# Patient Record
Sex: Female | Born: 2000
Health system: Southern US, Community
[De-identification: ages and names within clinical notes are randomized; demographics above are authoritative.]

## PROBLEM LIST (undated history)

## (undated) ENCOUNTER — Inpatient Hospital Stay (HOSPITAL_COMMUNITY): Payer: Self-pay

## (undated) DIAGNOSIS — O139 Gestational [pregnancy-induced] hypertension without significant proteinuria, unspecified trimester: Secondary | ICD-10-CM

## (undated) DIAGNOSIS — N3010 Interstitial cystitis (chronic) without hematuria: Secondary | ICD-10-CM

## (undated) DIAGNOSIS — I10 Essential (primary) hypertension: Secondary | ICD-10-CM

## (undated) DIAGNOSIS — Z789 Other specified health status: Secondary | ICD-10-CM

## (undated) HISTORY — DX: Gestational (pregnancy-induced) hypertension without significant proteinuria, unspecified trimester: O13.9

## (undated) HISTORY — PX: TUMOR REMOVAL: SHX12

## (undated) HISTORY — PX: OTHER SURGICAL HISTORY: SHX169

---
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
## 2018-02-10 DIAGNOSIS — N342 Other urethritis: Secondary | ICD-10-CM | POA: Diagnosis not present

## 2018-02-10 DIAGNOSIS — Z68.41 Body mass index (BMI) pediatric, less than 5th percentile for age: Secondary | ICD-10-CM | POA: Diagnosis not present

## 2018-02-10 DIAGNOSIS — R350 Frequency of micturition: Secondary | ICD-10-CM | POA: Diagnosis not present

## 2018-02-10 DIAGNOSIS — R300 Dysuria: Secondary | ICD-10-CM | POA: Diagnosis not present

## 2018-02-10 DIAGNOSIS — Z1389 Encounter for screening for other disorder: Secondary | ICD-10-CM | POA: Diagnosis not present

## 2018-03-29 DIAGNOSIS — R300 Dysuria: Secondary | ICD-10-CM | POA: Diagnosis not present

## 2018-03-29 DIAGNOSIS — N3001 Acute cystitis with hematuria: Secondary | ICD-10-CM | POA: Diagnosis not present

## 2018-04-07 DIAGNOSIS — Z68.41 Body mass index (BMI) pediatric, less than 5th percentile for age: Secondary | ICD-10-CM | POA: Diagnosis not present

## 2018-04-07 DIAGNOSIS — N342 Other urethritis: Secondary | ICD-10-CM | POA: Diagnosis not present

## 2018-04-07 DIAGNOSIS — Z23 Encounter for immunization: Secondary | ICD-10-CM | POA: Diagnosis not present

## 2018-04-07 DIAGNOSIS — R350 Frequency of micturition: Secondary | ICD-10-CM | POA: Diagnosis not present

## 2018-05-23 DIAGNOSIS — L03031 Cellulitis of right toe: Secondary | ICD-10-CM | POA: Diagnosis not present

## 2018-05-23 DIAGNOSIS — M79674 Pain in right toe(s): Secondary | ICD-10-CM | POA: Diagnosis not present

## 2018-05-24 DIAGNOSIS — L0390 Cellulitis, unspecified: Secondary | ICD-10-CM | POA: Diagnosis not present

## 2018-05-24 DIAGNOSIS — Z1389 Encounter for screening for other disorder: Secondary | ICD-10-CM | POA: Diagnosis not present

## 2018-05-24 DIAGNOSIS — Z68.41 Body mass index (BMI) pediatric, less than 5th percentile for age: Secondary | ICD-10-CM | POA: Diagnosis not present

## 2018-09-30 DIAGNOSIS — J329 Chronic sinusitis, unspecified: Secondary | ICD-10-CM | POA: Diagnosis not present

## 2018-09-30 DIAGNOSIS — J111 Influenza due to unidentified influenza virus with other respiratory manifestations: Secondary | ICD-10-CM | POA: Diagnosis not present

## 2018-11-19 DIAGNOSIS — R300 Dysuria: Secondary | ICD-10-CM | POA: Diagnosis not present

## 2018-11-29 DIAGNOSIS — R102 Pelvic and perineal pain: Secondary | ICD-10-CM | POA: Diagnosis not present

## 2018-11-29 DIAGNOSIS — Z90721 Acquired absence of ovaries, unilateral: Secondary | ICD-10-CM | POA: Diagnosis not present

## 2019-02-08 DIAGNOSIS — R358 Other polyuria: Secondary | ICD-10-CM | POA: Diagnosis not present

## 2019-02-08 DIAGNOSIS — Z1329 Encounter for screening for other suspected endocrine disorder: Secondary | ICD-10-CM | POA: Diagnosis not present

## 2019-02-08 DIAGNOSIS — Z0000 Encounter for general adult medical examination without abnormal findings: Secondary | ICD-10-CM | POA: Diagnosis not present

## 2019-02-08 DIAGNOSIS — E663 Overweight: Secondary | ICD-10-CM | POA: Diagnosis not present

## 2019-02-08 DIAGNOSIS — N342 Other urethritis: Secondary | ICD-10-CM | POA: Diagnosis not present

## 2019-02-08 DIAGNOSIS — N3020 Other chronic cystitis without hematuria: Secondary | ICD-10-CM | POA: Diagnosis not present

## 2019-02-15 DIAGNOSIS — E663 Overweight: Secondary | ICD-10-CM | POA: Diagnosis not present

## 2019-02-15 DIAGNOSIS — E785 Hyperlipidemia, unspecified: Secondary | ICD-10-CM | POA: Diagnosis not present

## 2019-02-15 DIAGNOSIS — N342 Other urethritis: Secondary | ICD-10-CM | POA: Diagnosis not present

## 2019-02-15 DIAGNOSIS — Z0001 Encounter for general adult medical examination with abnormal findings: Secondary | ICD-10-CM | POA: Diagnosis not present

## 2019-03-01 DIAGNOSIS — R300 Dysuria: Secondary | ICD-10-CM | POA: Diagnosis not present

## 2019-03-01 DIAGNOSIS — R3121 Asymptomatic microscopic hematuria: Secondary | ICD-10-CM | POA: Diagnosis not present

## 2019-03-21 DIAGNOSIS — N3020 Other chronic cystitis without hematuria: Secondary | ICD-10-CM | POA: Diagnosis not present

## 2019-03-21 DIAGNOSIS — R300 Dysuria: Secondary | ICD-10-CM | POA: Diagnosis not present

## 2019-04-05 DIAGNOSIS — R3121 Asymptomatic microscopic hematuria: Secondary | ICD-10-CM | POA: Diagnosis not present

## 2019-04-05 DIAGNOSIS — R3129 Other microscopic hematuria: Secondary | ICD-10-CM | POA: Diagnosis not present

## 2019-04-13 DIAGNOSIS — N3020 Other chronic cystitis without hematuria: Secondary | ICD-10-CM | POA: Diagnosis not present

## 2019-04-13 DIAGNOSIS — J069 Acute upper respiratory infection, unspecified: Secondary | ICD-10-CM | POA: Diagnosis not present

## 2019-04-13 DIAGNOSIS — R300 Dysuria: Secondary | ICD-10-CM | POA: Diagnosis not present

## 2019-04-19 DIAGNOSIS — R3121 Asymptomatic microscopic hematuria: Secondary | ICD-10-CM | POA: Diagnosis not present

## 2019-04-19 DIAGNOSIS — R300 Dysuria: Secondary | ICD-10-CM | POA: Diagnosis not present

## 2019-08-23 DIAGNOSIS — R300 Dysuria: Secondary | ICD-10-CM | POA: Diagnosis not present

## 2019-08-23 DIAGNOSIS — N3011 Interstitial cystitis (chronic) with hematuria: Secondary | ICD-10-CM | POA: Diagnosis not present

## 2019-08-23 DIAGNOSIS — N3020 Other chronic cystitis without hematuria: Secondary | ICD-10-CM | POA: Diagnosis not present

## 2019-08-23 DIAGNOSIS — J069 Acute upper respiratory infection, unspecified: Secondary | ICD-10-CM | POA: Diagnosis not present

## 2019-08-30 DIAGNOSIS — R8290 Unspecified abnormal findings in urine: Secondary | ICD-10-CM | POA: Diagnosis not present

## 2019-08-30 DIAGNOSIS — N3010 Interstitial cystitis (chronic) without hematuria: Secondary | ICD-10-CM | POA: Diagnosis not present

## 2019-09-16 DIAGNOSIS — Z20822 Contact with and (suspected) exposure to covid-19: Secondary | ICD-10-CM | POA: Diagnosis not present

## 2019-09-16 DIAGNOSIS — Z20828 Contact with and (suspected) exposure to other viral communicable diseases: Secondary | ICD-10-CM | POA: Diagnosis not present

## 2019-09-23 DIAGNOSIS — R3915 Urgency of urination: Secondary | ICD-10-CM | POA: Diagnosis not present

## 2019-09-23 DIAGNOSIS — M7918 Myalgia, other site: Secondary | ICD-10-CM | POA: Diagnosis not present

## 2019-09-23 DIAGNOSIS — R350 Frequency of micturition: Secondary | ICD-10-CM | POA: Diagnosis not present

## 2019-09-23 DIAGNOSIS — N3010 Interstitial cystitis (chronic) without hematuria: Secondary | ICD-10-CM | POA: Diagnosis not present

## 2020-03-13 DIAGNOSIS — B351 Tinea unguium: Secondary | ICD-10-CM | POA: Diagnosis not present

## 2020-03-13 DIAGNOSIS — R21 Rash and other nonspecific skin eruption: Secondary | ICD-10-CM | POA: Diagnosis not present

## 2020-03-13 DIAGNOSIS — J069 Acute upper respiratory infection, unspecified: Secondary | ICD-10-CM | POA: Diagnosis not present

## 2020-03-13 DIAGNOSIS — R300 Dysuria: Secondary | ICD-10-CM | POA: Diagnosis not present

## 2020-03-13 DIAGNOSIS — N3020 Other chronic cystitis without hematuria: Secondary | ICD-10-CM | POA: Diagnosis not present

## 2020-03-13 DIAGNOSIS — N3011 Interstitial cystitis (chronic) with hematuria: Secondary | ICD-10-CM | POA: Diagnosis not present

## 2020-08-06 ENCOUNTER — Ambulatory Visit
Admission: EM | Admit: 2020-08-06 | Discharge: 2020-08-06 | Disposition: A | Discharge status: Home / Self Care | Payer: BC Managed Care – PPO | Attending: Family Medicine | Admitting: Family Medicine

## 2020-08-06 ENCOUNTER — Other Ambulatory Visit: Payer: Self-pay

## 2020-08-06 DIAGNOSIS — B349 Viral infection, unspecified: Secondary | ICD-10-CM

## 2020-08-06 DIAGNOSIS — R52 Pain, unspecified: Secondary | ICD-10-CM

## 2020-08-06 DIAGNOSIS — R509 Fever, unspecified: Secondary | ICD-10-CM

## 2020-08-06 DIAGNOSIS — R519 Headache, unspecified: Secondary | ICD-10-CM | POA: Diagnosis not present

## 2020-08-06 DIAGNOSIS — R112 Nausea with vomiting, unspecified: Secondary | ICD-10-CM

## 2020-08-06 DIAGNOSIS — Z1152 Encounter for screening for COVID-19: Secondary | ICD-10-CM | POA: Diagnosis not present

## 2020-08-06 DIAGNOSIS — R000 Tachycardia, unspecified: Secondary | ICD-10-CM

## 2020-08-06 DIAGNOSIS — R059 Cough, unspecified: Secondary | ICD-10-CM

## 2020-08-06 DIAGNOSIS — J029 Acute pharyngitis, unspecified: Secondary | ICD-10-CM | POA: Diagnosis not present

## 2020-08-06 DIAGNOSIS — R6883 Chills (without fever): Secondary | ICD-10-CM

## 2020-08-06 MED ORDER — ONDANSETRON HCL 4 MG PO TABS: 4.0000 mg | ORAL_TABLET | Freq: Four times a day (QID) | ORAL | 0 refills | Status: DC

## 2020-08-06 NOTE — Discharge Instructions (Addendum)
I have sent in Zofran for you to take one tablet every 8 hours as needed for nausea.  Your COVID and Flu tests are pending.  You should self quarantine until the test results are back.    Take Tylenol or ibuprofen as needed for fever or discomfort.  Rest and keep yourself hydrated.    Follow-up with your primary care provider if your symptoms are not improving.

## 2020-08-06 NOTE — ED Provider Notes (Addendum)
[facility]   [id number] 08/06/20 Arrival Time: 4287   CC: COVID symptoms  SUBJECTIVE: History from: patient.  Julia Sanders is a 20 y.o. female who presents with abrupt onset of nasal congestion, PND, sore throat, fever, fatigue, body aches, chills nausea, vomiting, mild cough for the last day.  Denies sick exposure to COVID, flu or strep. Denies recent travel. Has negative history of Covid. Has not completed Covid vaccines. Has not taken OTC medications for this. There are no aggravating or alleviating factors. Denies previous symptoms in the past. Denies fever, fatigue, sinus pain, rhinorrhea, SOB, wheezing, chest pain, nausea, changes in bowel or bladder habits.    ROS: As per HPI.  All other pertinent ROS negative.     No past medical history on file. Past Surgical History:  Procedure Laterality Date  . cyst removed from jaw     No Known Allergies No current facility-administered medications on file prior to encounter.   Current Outpatient Medications on File Prior to Encounter  Medication Sig Dispense Refill  . ibuprofen (ADVIL,MOTRIN) 200 MG tablet Take 600 mg by mouth daily as needed. For pain     . naproxen sodium (ANAPROX) 220 MG tablet Take 220 mg by mouth at bedtime as needed. For pain      Social History   Socioeconomic History  . Marital status: Single    Spouse name: Not on file  . Number of children: Not on file  . Years of education: Not on file  . Highest education level: Not on file  Occupational History  . Not on file  Tobacco Use  . Smoking status: Never Smoker  . Smokeless tobacco: Not on file  Substance and Sexual Activity  . Alcohol use: No  . Drug use: No  . Sexual activity: Never  Other Topics Concern  . Not on file  Social History Narrative  . Not on file   Social Determinants of Health   Financial Resource Strain: Not on file  Food Insecurity: Not on file  Transportation Needs: Not on file  Physical Activity: Not on  file  Stress: Not on file  Social Connections: Not on file  Intimate Partner Violence: Not on file   No family history on file.  OBJECTIVE:  Vitals:   08/06/20 0904  BP: 122/86  Pulse: (!) 121  Resp: 16  Temp: 99.3 F (37.4 C)  TempSrc: Oral  SpO2: 96%     General appearance: alert; appears fatigued, but nontoxic; speaking in full sentences and tolerating own secretions HEENT: NCAT; Ears: EACs clear, TMs pearly gray; Eyes: PERRL.  EOM grossly intact. Sinuses: nontender; Nose: nares patent without rhinorrhea, Throat: oropharynx erythematous, cobblestoning present, tonsils non erythematous or enlarged, uvula midline  Neck: supple without LAD Lungs: unlabored respirations, symmetrical air entry; cough: absent; no respiratory distress; CTAB Heart: regular rate and rhythm.  Radial pulses 2+ symmetrical bilaterally Skin: warm and dry Psychological: alert and cooperative; normal mood and affect  LABS:  No results found for this or any previous visit (from the past 24 hour(s)).   ASSESSMENT & PLAN:  1. Viral illness   2. Encounter for screening for COVID-19   3. Sore throat   4. Nonintractable headache, unspecified chronicity pattern, unspecified headache type   5. Cough   6. Body aches   7. Chills   8. Nausea and vomiting, intractability of vomiting not specified, unspecified vomiting type   9. Tachycardia   10. Fever, unspecified fever cause  Meds ordered this encounter  Medications  . ondansetron (ZOFRAN) 4 MG tablet    Sig: Take 1 tablet (4 mg total) by mouth every 6 (six) hours.    Dispense:  12 tablet    Refill:  0    Order Specific Question:   Supervising Provider    Answer:   Merrilee Jansky [id number]   Prescribe Zofran for nausea Take as directed Continue supportive care at home COVID and flu testing ordered.  It will take between 1-2 days for test results. Someone will contact you regarding abnormal results.   Work note provided Patient should remain  in quarantine until they have received Covid results.  If negative you may resume normal activities (go back to work/school) while practicing hand hygiene, social distance, and mask wearing.  If positive, patient should remain in quarantine for 10 days from symptom onset AND greater than 72 hours after symptoms resolution (absence of fever without the use of fever-reducing medication and improvement in respiratory symptoms), whichever is longer Get plenty of rest and push fluids Use OTC zyrtec for nasal congestion, runny nose, and/or sore throat Use OTC flonase for nasal congestion and runny nose Use medications daily for symptom relief Use OTC medications like ibuprofen or tylenol as needed fever or pain Call or go to the ED if you have any new or worsening symptoms such as fever, worsening cough, shortness of breath, chest tightness, chest pain, turning blue, changes in mental status.  Reviewed expectations re: course of current medical issues. Questions answered. Outlined signs and symptoms indicating need for more acute intervention. Patient verbalized understanding. After Visit Summary given.         Moshe Cipro, NP 08/06/20 1601    Moshe Cipro, NP 08/06/20 [phone]

## 2020-08-06 NOTE — ED Triage Notes (Signed)
Triaged by provider  

## 2020-08-08 LAB — COVID-19, FLU A+B NAA
Influenza A, NAA: NOT DETECTED
Influenza B, NAA: NOT DETECTED
SARS-CoV-2, NAA: DETECTED — AB

## 2020-08-13 DIAGNOSIS — N898 Other specified noninflammatory disorders of vagina: Secondary | ICD-10-CM | POA: Diagnosis not present

## 2020-08-13 DIAGNOSIS — Z6829 Body mass index (BMI) 29.0-29.9, adult: Secondary | ICD-10-CM | POA: Diagnosis not present

## 2020-08-13 DIAGNOSIS — Z113 Encounter for screening for infections with a predominantly sexual mode of transmission: Secondary | ICD-10-CM | POA: Diagnosis not present

## 2020-09-20 DIAGNOSIS — A599 Trichomoniasis, unspecified: Secondary | ICD-10-CM | POA: Diagnosis not present

## 2020-09-20 DIAGNOSIS — Z6829 Body mass index (BMI) 29.0-29.9, adult: Secondary | ICD-10-CM | POA: Diagnosis not present

## 2020-09-20 DIAGNOSIS — N3010 Interstitial cystitis (chronic) without hematuria: Secondary | ICD-10-CM | POA: Diagnosis not present

## 2020-09-20 DIAGNOSIS — N9489 Other specified conditions associated with female genital organs and menstrual cycle: Secondary | ICD-10-CM | POA: Diagnosis not present

## 2020-09-20 DIAGNOSIS — Z113 Encounter for screening for infections with a predominantly sexual mode of transmission: Secondary | ICD-10-CM | POA: Diagnosis not present

## 2020-10-22 DIAGNOSIS — Z20822 Contact with and (suspected) exposure to covid-19: Secondary | ICD-10-CM | POA: Diagnosis not present

## 2020-10-22 DIAGNOSIS — Z01812 Encounter for preprocedural laboratory examination: Secondary | ICD-10-CM | POA: Diagnosis not present

## 2020-10-22 DIAGNOSIS — N3010 Interstitial cystitis (chronic) without hematuria: Secondary | ICD-10-CM | POA: Diagnosis not present

## 2020-10-26 DIAGNOSIS — M7918 Myalgia, other site: Secondary | ICD-10-CM | POA: Diagnosis not present

## 2020-10-26 DIAGNOSIS — N3010 Interstitial cystitis (chronic) without hematuria: Secondary | ICD-10-CM | POA: Diagnosis not present

## 2021-03-07 DIAGNOSIS — N911 Secondary amenorrhea: Secondary | ICD-10-CM | POA: Diagnosis not present

## 2021-03-07 DIAGNOSIS — O200 Threatened abortion: Secondary | ICD-10-CM | POA: Diagnosis not present

## 2021-03-08 ENCOUNTER — Encounter (HOSPITAL_COMMUNITY): Payer: Self-pay | Admitting: Obstetrics and Gynecology

## 2021-03-08 ENCOUNTER — Inpatient Hospital Stay (HOSPITAL_COMMUNITY)
Admission: AD | Admit: 2021-03-08 | Discharge: 2021-03-08 | Disposition: A | Discharge status: Home / Self Care | Payer: BC Managed Care – PPO | Attending: Obstetrics and Gynecology | Admitting: Obstetrics and Gynecology

## 2021-03-08 DIAGNOSIS — O26891 Other specified pregnancy related conditions, first trimester: Secondary | ICD-10-CM | POA: Diagnosis not present

## 2021-03-08 DIAGNOSIS — O209 Hemorrhage in early pregnancy, unspecified: Secondary | ICD-10-CM | POA: Diagnosis not present

## 2021-03-08 DIAGNOSIS — R42 Dizziness and giddiness: Secondary | ICD-10-CM | POA: Diagnosis not present

## 2021-03-08 DIAGNOSIS — Z3A09 9 weeks gestation of pregnancy: Secondary | ICD-10-CM | POA: Insufficient documentation

## 2021-03-08 DIAGNOSIS — O034 Incomplete spontaneous abortion without complication: Secondary | ICD-10-CM | POA: Diagnosis not present

## 2021-03-08 DIAGNOSIS — R1030 Lower abdominal pain, unspecified: Secondary | ICD-10-CM | POA: Diagnosis not present

## 2021-03-08 HISTORY — DX: Other specified health status: Z78.9

## 2021-03-08 LAB — CBC WITH DIFFERENTIAL/PLATELET
Abs Immature Granulocytes: 0.03 10*3/uL (ref 0.00–0.07)
Basophils Absolute: 0.1 10*3/uL (ref 0.0–0.1)
Basophils Relative: 1 %
Eosinophils Absolute: 0.2 10*3/uL (ref 0.0–0.5)
Eosinophils Relative: 2 %
HCT: 38.9 % (ref 36.0–46.0)
Hemoglobin: 13.2 g/dL (ref 12.0–15.0)
Immature Granulocytes: 0 %
Lymphocytes Relative: 25 %
Lymphs Abs: 2.7 10*3/uL (ref 0.7–4.0)
MCH: 30.2 pg (ref 26.0–34.0)
MCHC: 33.9 g/dL (ref 30.0–36.0)
MCV: 89 fL (ref 80.0–100.0)
Monocytes Absolute: 0.9 10*3/uL (ref 0.1–1.0)
Monocytes Relative: 8 %
Neutro Abs: 6.7 10*3/uL (ref 1.7–7.7)
Neutrophils Relative %: 64 %
Platelets: 392 10*3/uL (ref 150–400)
RBC: 4.37 MIL/uL (ref 3.87–5.11)
RDW: 12 % (ref 11.5–15.5)
WBC: 10.6 10*3/uL — ABNORMAL HIGH (ref 4.0–10.5)
nRBC: 0 % (ref 0.0–0.2)

## 2021-03-08 LAB — ABO/RH: ABO/RH(D): O POS

## 2021-03-08 LAB — HCG, QUANTITATIVE, PREGNANCY: hCG, Beta Chain, Quant, S: 8631 m[IU]/mL — ABNORMAL HIGH (ref <5)

## 2021-03-08 MED ORDER — ACETAMINOPHEN 500 MG PO TABS
1000.0000 mg | ORAL_TABLET | Freq: Once | ORAL | Status: AC
  Administered 2021-03-08: 1000 mg via ORAL
  Filled 2021-03-08: qty 2

## 2021-03-08 NOTE — Discharge Instructions (Signed)
It is likely that you may be having a miscarriage.  If your bleeding is getting worse and you have any lightheadedness/dizziness/shortness of breath/worsening fatigue, fever, or abdominal pain worsens please return to the MAU.  You can take Tylenol 1000 mg at a time up to 4 times daily, you can also use ibuprofen 400 mg every 6 hours as needed for discomfort.  Please make sure that you stay hydrated with plenty of fluids.

## 2021-03-08 NOTE — MAU Note (Signed)
Started spotting on Monday.  Has gotten heavier, changed 3 soaked pads in last hour, passing golf ball sized clots. Having severe pain in LLQ. Pain started today, become constant since 1400.  Was at dr's office yesterday - did not see anything on [place], HCG was 8788, O+.  Watery loose stools x2 today

## 2021-03-08 NOTE — MAU Provider Note (Signed)
MAU NOTE  History     CSN: [id number]  Arrival date and time: 03/08/21 1648   Chief Complaint  Patient presents with   Abdominal Pain   Vaginal Bleeding        Julia Sanders is a 20 year old female presenting for evaluation of continued vaginal bleeding and left lower abdominal cramping.  She was seen yesterday by her OB/GYN, Dr. Lorane Gell, due to bleeding since Monday in the setting of first trimester pregnancy.  By her LMP she was approximately [redacted] weeks gestation. U/S was suspicious for early pregnancy loss-gestational sac/yolk sac seen with no fetal pole. Beta-hCG 8788 at that time.  She initially started having brown spotting on Monday, 8/1, however by Wednesday she started to have more bright red vaginal bleeding.  Since this late afternoon, she has had constant left lower quadrant abdominal cramping (sometimes sharp, 6/10) and heavier vaginal bleeding.  She has changed 2 pads in the past few hours with a few golf ball sized clots.  She has not noticed products of conception.  She has not tried anything to make the abdominal pain better.  She felt a little lightheaded earlier today but not anymore.  Denies any current lightheadedness/dizziness, shortness of breath, CP, fatigue or syncopal episodes, V/D, fever, chills, or abnormal vaginal discharge. Eating and drinking as normal.   OB History     Gravida  1   Para      Term      Preterm      AB      Living         SAB      IAB      Ectopic      Multiple      Live Births              Past Medical History:  Diagnosis Date   Medical history non-contributory     Past Surgical History:  Procedure Laterality Date   cyst removed from jaw     TUMOR REMOVAL     Left ovary    No family history on file.  Social History   Tobacco Use   Smoking status: Never   Smokeless tobacco: Never  Vaping Use   Vaping Use: Never used  Substance Use Topics   Alcohol use: No   Drug use: No    Allergies: No Known  Allergies  No medications prior to admission.    Review of Systems  Constitutional:  Negative for fatigue and fever.  Respiratory:  Negative for chest tightness and shortness of breath.   Gastrointestinal:  Positive for abdominal pain. Negative for abdominal distention, constipation, diarrhea and vomiting.  Genitourinary:  Positive for pelvic pain and vaginal bleeding. Negative for dysuria, vaginal discharge and vaginal pain.  Neurological:  Negative for dizziness, syncope, weakness and light-headedness.  Physical Exam   Blood pressure 127/84, pulse 86, temperature 98.1 F (36.7 C), temperature source Oral, resp. rate 18, height 6\' 2"  (1.88 m), weight 76.1 kg, last menstrual period 01/03/2021, SpO2 100 %.  Physical Exam Constitutional:      General: She is not in acute distress.    Appearance: Normal appearance. She is not ill-appearing or diaphoretic.     Comments: Comfortably sitting in bed.   HENT:     Head: Normocephalic and atraumatic.     Mouth/Throat:     Mouth: Mucous membranes are moist.  Eyes:     Extraocular Movements: Extraocular movements intact.  Cardiovascular:     Pulses: Normal pulses.  Pulmonary:     Effort: Pulmonary effort is normal.  Abdominal:     General: Abdomen is flat. There is no distension.     Palpations: Abdomen is soft.     Tenderness: There is no left CVA tenderness.     Comments: Mild tenderness in LLQ and suprapubic region without any rebounding or guarding. No facial grimace with palpation. No tenderness elsewhere with deep palpation. Negative Mcburney point tenderness.    Genitourinary:    Comments: Pelvic exam: VULVA: normal appearing vulva with no masses, tenderness or lesions, VAGINA: normal appearing vagina with normal color, no lesions, CERVIX: normal appearing cervix without lesions with vaginal bleeding present through cervical os. Mild to moderate amount of bloody mucus present in vaginal canal without pooling. No products of conception  noted.  Musculoskeletal:     Cervical back: Normal range of motion.  Skin:    General: Skin is warm and dry.     Capillary Refill: Capillary refill takes less than 2 seconds.  Neurological:     Mental Status: She is alert and oriented to person, place, and time.  Psychiatric:        Mood and Affect: Mood normal.        Behavior: Behavior normal.    MAU Course   MDM Sterile speculum exam CBC hemoglobin 13.2 ABO/Rh O+, no RhoGAM needed Beta-hCG quant 8,631 Tylenol for pain  Reassessment: Abdominal pain has significantly improved s/p Tylenol, now with 2-3/10.  She also just changed her pad with much lighter bleeding than earlier today.  She overall feels well and would like to go home.  Assessment and Plan   20 year old G1P0 presenting for evaluation of worsening vaginal bleeding/left-sided abdominal cramping in the setting of suspected early pregnancy loss via U/S on 8/4.  Gestational and yolk sac visualized without fetal pole at that time.  Afebrile and hemodynamically stable, overall well-appearing.  Benign abdominal exam today, with significant improvement following Tylenol only.  Beta-hCG downtrending.  CBC without anemia.  Offered repeat U/S, however with improvement in symptoms patient opted to go home.  Provided reassurance and expectations of SAB.  MAU precautions discussed, especially if vaginal bleeding/abdominal pain worsens or symptomatic anemia.  Tylenol/ibuprofen as needed.  Maintain follow-up lab/beta-hCG appointment with Dr. Jaynee Eagles office.  Discharged home in stable condition.  Allayne Stack 03/08/2021, 10:29 PM

## 2021-03-12 DIAGNOSIS — O209 Hemorrhage in early pregnancy, unspecified: Secondary | ICD-10-CM | POA: Diagnosis not present

## 2022-06-30 LAB — OB RESULTS CONSOLE RUBELLA ANTIBODY, IGM: Rubella: IMMUNE

## 2022-06-30 LAB — OB RESULTS CONSOLE RPR: RPR: NONREACTIVE

## 2022-06-30 LAB — OB RESULTS CONSOLE HEPATITIS B SURFACE ANTIGEN: Hepatitis B Surface Ag: NEGATIVE

## 2022-06-30 LAB — HEPATITIS C ANTIBODY: HCV Ab: NEGATIVE

## 2022-08-04 NOTE — L&D Delivery Note (Signed)
Delivery Note Julia Sanders is a 22 y.o. G2P0010 at [redacted]w[redacted]d admitted for IOL for preeclampsia w/severe features (HA, visual changes).   GBS Status:   positive, treated w/PCN  Labor course: Initial SVE: 1/60/-2. Augmentation with: AROM, Pitocin, Cytotec, and IP Foley. She then progressed to complete.  ROM: 3h 8m with clear fluid  Birth: Delivery of a Live born female  Birth Weight:   APGAR: 8, 9  Newborn Delivery   Birth date/time: 12/16/2024 02:12:52 Delivery type: Vaginal, Spontaneous         Delivered via spontaneous vaginal delivery (Presentation: OA ). Nuchal cord present: No.  Shoulders and body delivered in usual fashion. Infant placed directly on mom's abdomen for bonding/skin-to-skin, baby dried and stimulated. Cord clamped x 2 after 1 minute and cut by FOB.  Cord blood collected. Placenta delivered-Spontaneous  with 3 vessels . 20u Pitocin in 500cc LR given as a bolus prior delivery of placenta. TXA 1gm given IV near the end of 2nd stage Fundus firm with massage. Placenta inspected and appears to be intact with a 3 VC.  Sponge and instrument count were correct x2.  Intrapartum complications:  None Anesthesia:  epidural Lacerations:  1st degree Suture Repair: 3.0 Monocryl EBL (mL):248.00     Mom to postpartum.  Baby to Couplet care / Skin to Skin. Placenta to L&D   Plans to Breastfeed Contraception:  unsure Circumcision: wants inpatient  Note sent to [facility]: FT for pp visit.  Delivery Report:   Review the Delivery Report for details.     Signed: Jacklyn Shell, DNP,CNM 12/16/2022, 2:44 AM

## 2022-10-20 LAB — OB RESULTS CONSOLE HIV ANTIBODY (ROUTINE TESTING): HIV: NONREACTIVE

## 2022-12-09 LAB — OB RESULTS CONSOLE GBS: GBS: POSITIVE

## 2022-12-09 LAB — OB RESULTS CONSOLE GC/CHLAMYDIA
Chlamydia: NEGATIVE
Neisseria Gonorrhea: NEGATIVE

## 2022-12-15 ENCOUNTER — Inpatient Hospital Stay (HOSPITAL_COMMUNITY): Payer: 59 | Admitting: Anesthesiology

## 2022-12-15 ENCOUNTER — Encounter (HOSPITAL_COMMUNITY): Payer: Self-pay | Admitting: Obstetrics & Gynecology

## 2022-12-15 ENCOUNTER — Inpatient Hospital Stay (HOSPITAL_COMMUNITY)
Admission: AD | Admit: 2022-12-15 | Discharge: 2022-12-18 | DRG: 807 | Disposition: A | Discharge status: Home / Self Care | Payer: 59 | Attending: Obstetrics and Gynecology | Admitting: Obstetrics and Gynecology

## 2022-12-15 DIAGNOSIS — Z3A36 36 weeks gestation of pregnancy: Secondary | ICD-10-CM | POA: Diagnosis not present

## 2022-12-15 DIAGNOSIS — O1414 Severe pre-eclampsia complicating childbirth: Principal | ICD-10-CM | POA: Diagnosis present

## 2022-12-15 DIAGNOSIS — O368130 Decreased fetal movements, third trimester, not applicable or unspecified: Secondary | ICD-10-CM | POA: Diagnosis present

## 2022-12-15 DIAGNOSIS — O99820 Streptococcus B carrier state complicating pregnancy: Secondary | ICD-10-CM | POA: Diagnosis not present

## 2022-12-15 DIAGNOSIS — O1410 Severe pre-eclampsia, unspecified trimester: Secondary | ICD-10-CM | POA: Diagnosis present

## 2022-12-15 DIAGNOSIS — Z3490 Encounter for supervision of normal pregnancy, unspecified, unspecified trimester: Principal | ICD-10-CM | POA: Diagnosis present

## 2022-12-15 DIAGNOSIS — O1413 Severe pre-eclampsia, third trimester: Secondary | ICD-10-CM

## 2022-12-15 DIAGNOSIS — O99824 Streptococcus B carrier state complicating childbirth: Secondary | ICD-10-CM | POA: Diagnosis present

## 2022-12-15 HISTORY — DX: Essential (primary) hypertension: I10

## 2022-12-15 LAB — CBC
HCT: 33.3 % — ABNORMAL LOW (ref 36.0–46.0)
HCT: 31.9 % — ABNORMAL LOW (ref 36.0–46.0)
Hemoglobin: 10.7 g/dL — ABNORMAL LOW (ref 12.0–15.0)
Hemoglobin: 10.3 g/dL — ABNORMAL LOW (ref 12.0–15.0)
MCH: 27.5 pg (ref 26.0–34.0)
MCH: 27.2 pg (ref 26.0–34.0)
MCHC: 32.1 g/dL (ref 30.0–36.0)
MCHC: 32.3 g/dL (ref 30.0–36.0)
MCV: 85.6 fL (ref 80.0–100.0)
MCV: 84.2 fL (ref 80.0–100.0)
Platelets: 339 10*3/uL (ref 150–400)
Platelets: 347 10*3/uL (ref 150–400)
RBC: 3.89 MIL/uL (ref 3.87–5.11)
RBC: 3.79 MIL/uL — ABNORMAL LOW (ref 3.87–5.11)
RDW: 12.9 % (ref 11.5–15.5)
RDW: 12.9 % (ref 11.5–15.5)
WBC: 10.5 10*3/uL (ref 4.0–10.5)
WBC: 15.6 10*3/uL — ABNORMAL HIGH (ref 4.0–10.5)
nRBC: 0 % (ref 0.0–0.2)
nRBC: 0 % (ref 0.0–0.2)

## 2022-12-15 LAB — COMPREHENSIVE METABOLIC PANEL
ALT: 6 U/L (ref 0–44)
AST: 17 U/L (ref 15–41)
Albumin: 2.4 g/dL — ABNORMAL LOW (ref 3.5–5.0)
Alkaline Phosphatase: 102 U/L (ref 38–126)
Anion gap: 10 (ref 5–15)
BUN: 5 mg/dL — ABNORMAL LOW (ref 6–20)
CO2: 20 mmol/L — ABNORMAL LOW (ref 22–32)
Calcium: 8.7 mg/dL — ABNORMAL LOW (ref 8.9–10.3)
Chloride: 104 mmol/L (ref 98–111)
Creatinine, Ser: 0.63 mg/dL (ref 0.44–1.00)
GFR, Estimated: >60 mL/min (ref >60)
Glucose, Bld: 110 mg/dL — ABNORMAL HIGH (ref 70–99)
Potassium: 3.6 mmol/L (ref 3.5–5.1)
Sodium: 134 mmol/L — ABNORMAL LOW (ref 135–145)
Total Bilirubin: 0.4 mg/dL (ref 0.3–1.2)
Total Protein: 6.3 g/dL — ABNORMAL LOW (ref 6.5–8.1)

## 2022-12-15 LAB — PROTEIN / CREATININE RATIO, URINE
Creatinine, Urine: 153 mg/dL
Protein Creatinine Ratio: 0.11 mg/mg{Cre} (ref 0.00–0.15)
Total Protein, Urine: 17 mg/dL

## 2022-12-15 LAB — RPR: RPR Ser Ql: NONREACTIVE

## 2022-12-15 LAB — TYPE AND SCREEN
ABO/RH(D): O POS
Antibody Screen: NEGATIVE

## 2022-12-15 MED ORDER — MISOPROSTOL 25 MCG QUARTER TABLET
25.0000 ug | ORAL_TABLET | ORAL | Status: DC
  Administered 2022-12-15: 25 ug via VAGINAL
  Filled 2022-12-15: qty 1

## 2022-12-15 MED ORDER — FENTANYL-BUPIVACAINE-NACL 0.5-0.125-0.9 MG/250ML-% EP SOLN
EPIDURAL | Status: DC | PRN
  Administered 2022-12-15: 12 mL/h via EPIDURAL

## 2022-12-15 MED ORDER — MAGNESIUM SULFATE 40 GM/1000ML IV SOLN
INTRAVENOUS | Status: AC
  Filled 2022-12-15: qty 1000

## 2022-12-15 MED ORDER — LACTATED RINGERS IV SOLN: 500.0000 mL | Freq: Once | INTRAVENOUS | Status: DC

## 2022-12-15 MED ORDER — ACETAMINOPHEN 325 MG PO TABS
650.0000 mg | ORAL_TABLET | ORAL | Status: DC | PRN
  Administered 2022-12-15 – 2022-12-16 (×2): 650 mg via ORAL
  Filled 2022-12-15 (×2): qty 2

## 2022-12-15 MED ORDER — LIDOCAINE HCL (PF) 1 % IJ SOLN
INTRAMUSCULAR | Status: DC | PRN
  Administered 2022-12-15: 2 mL via EPIDURAL
  Administered 2022-12-15: 10 mL via EPIDURAL

## 2022-12-15 MED ORDER — EPHEDRINE 5 MG/ML INJ: 10.0000 mg | INTRAVENOUS | Status: DC | PRN

## 2022-12-15 MED ORDER — PHENYLEPHRINE 80 MCG/ML (10ML) SYRINGE FOR IV PUSH (FOR BLOOD PRESSURE SUPPORT): 80.0000 ug | PREFILLED_SYRINGE | INTRAVENOUS | Status: DC | PRN

## 2022-12-15 MED ORDER — LIDOCAINE HCL (PF) 1 % IJ SOLN: 30.0000 mL | INTRAMUSCULAR | Status: DC | PRN

## 2022-12-15 MED ORDER — OXYTOCIN-SODIUM CHLORIDE 30-0.9 UT/500ML-% IV SOLN
1.0000 m[IU]/min | INTRAVENOUS | Status: DC
  Administered 2022-12-15: 2 m[IU]/min via INTRAVENOUS
  Filled 2022-12-15: qty 500

## 2022-12-15 MED ORDER — OXYCODONE-ACETAMINOPHEN 5-325 MG PO TABS: 1.0000 | ORAL_TABLET | ORAL | Status: DC | PRN

## 2022-12-15 MED ORDER — OXYTOCIN-SODIUM CHLORIDE 30-0.9 UT/500ML-% IV SOLN: 2.5000 [IU]/h | INTRAVENOUS | Status: DC

## 2022-12-15 MED ORDER — TERBUTALINE SULFATE 1 MG/ML IJ SOLN: 0.2500 mg | Freq: Once | INTRAMUSCULAR | Status: DC | PRN

## 2022-12-15 MED ORDER — ONDANSETRON HCL 4 MG/2ML IJ SOLN
4.0000 mg | Freq: Four times a day (QID) | INTRAMUSCULAR | Status: DC | PRN
  Administered 2022-12-15: 4 mg via INTRAVENOUS
  Filled 2022-12-15: qty 2

## 2022-12-15 MED ORDER — SODIUM CHLORIDE 0.9 % IV SOLN
5.0000 10*6.[IU] | Freq: Once | INTRAVENOUS | Status: AC
  Administered 2022-12-15: 5 10*6.[IU] via INTRAVENOUS
  Filled 2022-12-15: qty 5

## 2022-12-15 MED ORDER — PENICILLIN G POT IN DEXTROSE 60000 UNIT/ML IV SOLN
3.0000 10*6.[IU] | INTRAVENOUS | Status: DC
  Administered 2022-12-15 (×2): 3 10*6.[IU] via INTRAVENOUS
  Filled 2022-12-15 (×5): qty 50

## 2022-12-15 MED ORDER — FENTANYL-BUPIVACAINE-NACL 0.5-0.125-0.9 MG/250ML-% EP SOLN
12.0000 mL/h | EPIDURAL | Status: DC | PRN
  Filled 2022-12-15: qty 250

## 2022-12-15 MED ORDER — DIPHENHYDRAMINE HCL 50 MG/ML IJ SOLN: 12.5000 mg | INTRAMUSCULAR | Status: DC | PRN

## 2022-12-15 MED ORDER — MAGNESIUM SULFATE BOLUS VIA INFUSION
6.0000 g | Freq: Once | INTRAVENOUS | Status: AC
  Administered 2022-12-15: 6 g via INTRAVENOUS
  Filled 2022-12-15: qty 1000

## 2022-12-15 MED ORDER — LACTATED RINGERS IV SOLN: 500.0000 mL | INTRAVENOUS | Status: DC | PRN

## 2022-12-15 MED ORDER — OXYCODONE-ACETAMINOPHEN 5-325 MG PO TABS: 2.0000 | ORAL_TABLET | ORAL | Status: DC | PRN

## 2022-12-15 MED ORDER — LABETALOL HCL 100 MG PO TABS: 100.0000 mg | ORAL_TABLET | Freq: Three times a day (TID) | ORAL | Status: DC

## 2022-12-15 MED ORDER — LACTATED RINGERS IV SOLN: INTRAVENOUS | Status: DC

## 2022-12-15 MED ORDER — SOD CITRATE-CITRIC ACID 500-334 MG/5ML PO SOLN: 30.0000 mL | ORAL | Status: DC | PRN

## 2022-12-15 MED ORDER — OXYTOCIN BOLUS FROM INFUSION
333.0000 mL | Freq: Once | INTRAVENOUS | Status: AC
  Administered 2022-12-16: 333 mL via INTRAVENOUS

## 2022-12-15 MED ORDER — MAGNESIUM SULFATE 40 GM/1000ML IV SOLN
2.0000 g/h | INTRAVENOUS | Status: AC
  Administered 2022-12-16: 2 g/h via INTRAVENOUS
  Filled 2022-12-15 (×2): qty 1000

## 2022-12-15 MED ORDER — MISOPROSTOL 50MCG HALF TABLET
50.0000 ug | ORAL_TABLET | ORAL | Status: DC
  Administered 2022-12-15: 50 ug via ORAL
  Filled 2022-12-15: qty 1

## 2022-12-15 NOTE — MAU Provider Note (Signed)
History     CSN: [id number]  Arrival date and time: 12/15/22 1118   None     Chief Complaint  Patient presents with   Headache   Hypertension   Decreased Fetal Movement   HPI This is a 22 year old G2, P0 at 36 weeks and 4 days with a pregnancy complicated by gestational hypertension.  She received her prenatal care at [facility] and was diagnosed with gestational hypertension that started the beginning of April.  She was having daily severe headaches and had a couple of severe range blood pressures.  She was placed on labetalol 100 mg, which was titrated up to 3 times a day.  Her headaches did improve and her blood pressures have remained in the mild range.  However, her headaches have began to worsen.  She rates her headaches 8 out of 10.  Additionally she gets flashing lights in her vision that last for several minutes.  She reports decreased fetal movement since earlier today.    OB History     Gravida  2   Para      Term      Preterm      AB  1   Living         SAB  1   IAB      Ectopic      Multiple      Live Births              Past Medical History:  Diagnosis Date   Hypertension    Medical history non-contributory     Past Surgical History:  Procedure Laterality Date   cyst removed from jaw     TUMOR REMOVAL     Left ovary    No family history on file.  Social History   Tobacco Use   Smoking status: Never   Smokeless tobacco: Never  Vaping Use   Vaping Use: Never used  Substance Use Topics   Alcohol use: No   Drug use: No    Allergies: No Known Allergies  Medications Prior to Admission  Medication Sig Dispense Refill Last Dose   labetalol (NORMODYNE) 300 MG tablet Take 100 mg by mouth 3 (three) times daily.      amitriptyline (ELAVIL) 25 MG tablet Take 50 mg by mouth at bedtime. (Patient not taking: Reported on 12/15/2022)   Not Taking   hydrOXYzine (ATARAX/VISTARIL) 25 MG tablet Take 50 mg by mouth  every evening.      ibuprofen (ADVIL,MOTRIN) 200 MG tablet Take 600 mg by mouth daily as needed. For pain  (Patient not taking: Reported on 12/15/2022)   Not Taking    Review of Systems Physical Exam   Blood pressure (!) 143/99, pulse 98, temperature 98.1 F (36.7 C), temperature source Oral, resp. rate 18, height 6\' 1"  (1.854 m), weight 112.6 kg, SpO2 100 %, unknown if currently breastfeeding.  Physical Exam Vitals and nursing note reviewed.  Constitutional:      Appearance: She is well-developed.  Cardiovascular:     Rate and Rhythm: Normal rate and regular rhythm.  Pulmonary:     Effort: Pulmonary effort is normal.     Breath sounds: Normal breath sounds.  Abdominal:     Palpations: Abdomen is soft.  Skin:    General: Skin is warm and dry.  Neurological:     Mental Status: She is alert.  Psychiatric:        Mood and Affect: Mood normal.  Behavior: Behavior normal.     MAU Course  Procedures NST:  Baseline: 140  Variability: moderate Accelerations: present  Decelerations: none Contractions: none  MDM The patient is already diagnosed with gestational hypertension.  At this point, with severe headaches and vision changes, the patient qualifies for diagnosis of severe features.  Will get labs and start mag.  As the patient is past 34 weeks, will move towards delivery.  Records reviewed in care everywhere.  Patient is GBS positive, will start penicillin for GBS prophylaxis.  Assessment and Plan  admit  Levie Heritage 12/15/2022, 12:50 PM

## 2022-12-15 NOTE — H&P (Signed)
OBSTETRIC ADMISSION HISTORY AND PHYSICAL  Julia Sanders is a 22 y.o. female G2P0010 with IUP at [redacted]w[redacted]d by LMP presenting for IOL for Pre E w/ SF. She reports +FMs, No LOF, no VB, no blurry vision, headaches or peripheral edema, and RUQ pain.  She plans on breast feeding. She request POPs for birth control. She received her prenatal care at  [facility]    Dating: By LMP --->  Estimated Date of Delivery: 01/08/23  Sono:    @[redacted]w[redacted]d , CWD, normal anatomy, cephalic presentation([redacted]w[redacted]d), Anterior placenta , 1328g, 51% EFW   Prenatal History/Complications: gHTN  Past Medical History: Past Medical History:  Diagnosis Date   Hypertension    Medical history non-contributory     Past Surgical History: Past Surgical History:  Procedure Laterality Date   cyst removed from jaw     TUMOR REMOVAL     Left ovary    Obstetrical History: OB History     Gravida  2   Para      Term      Preterm      AB  1   Living         SAB  1   IAB      Ectopic      Multiple      Live Births              Social History Social History   Socioeconomic History   Marital status: Single    Spouse name: Not on file   Number of children: Not on file   Years of education: Not on file   Highest education level: Not on file  Occupational History   Not on file  Tobacco Use   Smoking status: Never   Smokeless tobacco: Never  Vaping Use   Vaping Use: Never used  Substance and Sexual Activity   Alcohol use: No   Drug use: No   Sexual activity: Never  Other Topics Concern   Not on file  Social History Narrative   Not on file   Social Determinants of Health   Financial Resource Strain: Not on file  Food Insecurity: No Food Insecurity (12/15/2022)   Hunger Vital Sign    Worried About Running Out of Food in the Last Year: Never true    Ran Out of Food in the Last Year: Never true  Transportation Needs: Not on file  Physical Activity: Not on file  Stress: Not on file  Social  Connections: Not on file    Family History: History reviewed. No pertinent family history.  Allergies: No Known Allergies  Medications Prior to Admission  Medication Sig Dispense Refill Last Dose   labetalol (NORMODYNE) 300 MG tablet Take 100 mg by mouth 3 (three) times daily.      amitriptyline (ELAVIL) 25 MG tablet Take 50 mg by mouth at bedtime. (Patient not taking: Reported on 12/15/2022)   Not Taking   hydrOXYzine (ATARAX/VISTARIL) 25 MG tablet Take 50 mg by mouth every evening.      ibuprofen (ADVIL,MOTRIN) 200 MG tablet Take 600 mg by mouth daily as needed. For pain  (Patient not taking: Reported on 12/15/2022)   Not Taking     Review of Systems   All systems reviewed and negative except as stated in HPI  Blood pressure (!) 143/86, pulse 86, temperature 98.2 F (36.8 C), temperature source Oral, resp. rate 18, height 6\' 1"  (1.854 m), weight 112.6 kg, SpO2 100 %, unknown if currently breastfeeding. General appearance:  alert, cooperative, and appears stated age Lungs: normal work of breathing  Heart: regular rate and rhythm Abdomen: soft, non-tender; bowel sounds normal Pelvic: 1/60/-2 Presentation: cephalic Fetal monitoringBaseline: 145 bpm, Variability: Good {> 6 bpm), Accelerations: Reactive, and Decelerations: Absent Uterine activityNone Dilation: 2 Effacement (%): 50 Station: -2 Exam by:: Cy Bresee   Prenatal labs: ABO, Rh: --/--/O POS (05/13 1307) Antibody: NEG (05/13 1307) Rubella:  Immune RPR: NON REACTIVE (05/13 1307)  HBsAg:   NR HIV:   NR GBS:   Positive 1 hr Glucola Normal Anatomy US Normal  Prenatal Transfer Tool  Maternal Diabetes: No Genetic Screening: Normal Maternal Ultrasounds/Referrals: Normal Fetal Ultrasounds or other Referrals:  Referred to Materal Fetal Medicine  Maternal Substance Abuse:  No Significant Maternal Medications:  Meds include: Other: Labetalol  Significant Maternal Lab Results:  Group B Strep positive Number of Prenatal  Visits:greater than 3 verified prenatal visits Other Comments:  None  Results for orders placed or performed during the hospital encounter of 12/15/22 (from the past 24 hour(s))  CBC   Collection Time: 12/15/22  1:07 PM  Result Value Ref Range   WBC 10.5 4.0 - 10.5 K/uL   RBC 3.89 3.87 - 5.11 MIL/uL   Hemoglobin 10.7 (L) 12.0 - 15.0 g/dL   HCT 09.8 (L) 11.9 - 14.7 %   MCV 85.6 80.0 - 100.0 fL   MCH 27.5 26.0 - 34.0 pg   MCHC 32.1 30.0 - 36.0 g/dL   RDW 82.9 56.2 - 13.0 %   Platelets 339 150 - 400 K/uL   nRBC 0.0 0.0 - 0.2 %  Comprehensive metabolic panel   Collection Time: 12/15/22  1:07 PM  Result Value Ref Range   Sodium 134 (L) 135 - 145 mmol/L   Potassium 3.6 3.5 - 5.1 mmol/L   Chloride 104 98 - 111 mmol/L   CO2 20 (L) 22 - 32 mmol/L   Glucose, Bld 110 (H) 70 - 99 mg/dL   BUN 5 (L) 6 - 20 mg/dL   Creatinine, Ser 8.65 0.44 - 1.00 mg/dL   Calcium 8.7 (L) 8.9 - 10.3 mg/dL   Total Protein 6.3 (L) 6.5 - 8.1 g/dL   Albumin 2.4 (L) 3.5 - 5.0 g/dL   AST 17 15 - 41 U/L   ALT 6 0 - 44 U/L   Alkaline Phosphatase 102 38 - 126 U/L   Total Bilirubin 0.4 0.3 - 1.2 mg/dL   GFR, Estimated >78 >46 mL/min   Anion gap 10 5 - 15  Protein / creatinine ratio, urine   Collection Time: 12/15/22  1:07 PM  Result Value Ref Range   Creatinine, Urine 153 mg/dL   Total Protein, Urine 17 mg/dL   Protein Creatinine Ratio 0.11 0.00 - 0.15 mg/mg[Cre]  RPR   Collection Time: 12/15/22  1:07 PM  Result Value Ref Range   RPR Ser Ql NON REACTIVE NON REACTIVE  Type and screen [facility]   Collection Time: 12/15/22  1:07 PM  Result Value Ref Range   ABO/RH(D) O POS    Antibody Screen NEG    Sample Expiration      12/18/2022,2359 Performed at [facility], [address], [place], [place] [address]     Patient Active Problem List   Diagnosis Date Noted   Encounter for induction of labor 12/15/2022   Indication for care in labor or delivery 12/15/2022     Assessment/Plan:  Julia Sanders is a 22 y.o. G2P0010 at [redacted]w[redacted]d here for  IOL for pre eclampsia w/ SF  #Labor:Induction started with FB and cytotec #Pain: Per patietn request  #FWB: Cat 1  #ID:   GBS positive  #MOF: Breast  #MOC:POPs #Circ:  Yes  #Pre E w/ SF: Mild range BP. Visual disturbances. Will start magnesium and monitor BP  Celedonio Savage, MD  12/15/2022, 3:49 PM

## 2022-12-15 NOTE — Progress Notes (Signed)
Patient Vitals for the past 4 hrs:  BP Temp Temp src Pulse Resp SpO2  12/15/22 2245 120/70 -- -- 74 -- 99 %  12/15/22 2230 118/76 -- -- 70 -- 99 %  12/15/22 2225 133/75 -- -- 77 -- 98 %  12/15/22 2215 119/70 -- -- 76 -- 97 %  12/15/22 2210 123/68 -- -- 71 -- 97 %  12/15/22 2209 (!) 110/55 -- -- 80 -- --  12/15/22 2205 124/69 -- -- 84 -- 97 %  12/15/22 2202 (!) 123/54 -- -- 84 -- 99 %  12/15/22 2155 113/60 -- -- 75 -- 98 %  12/15/22 2150 127/67 -- -- 89 -- 99 %  12/15/22 2145 130/62 -- -- 99 -- 99 %  12/15/22 2140 (!) 144/79 -- -- 99 -- 100 %  12/15/22 2136 138/80 -- -- 92 -- 100 %  12/15/22 2030 130/83 -- -- 94 -- --  12/15/22 2010 127/81 98.3 F (36.8 C) Oral 91 16 --  12/15/22 2000 134/76 -- -- (!) 101 -- --  12/15/22 1931 122/62 -- -- 89 -- --  12/15/22 1905 125/73 -- -- 99 -- --   Comfortable w/epidural.  FHR Cat 1.  HA relieved with Tylenol.  Denies visual changes, RUQ pain.  Cx 6/60/-2.  AROM w/clear fluid. IUPC placed.  MVUs ~ 120.  Pitocin currently at 2 mu/min.  Will titrate up until labor adequate. MgSO4 @ 2gm/hr.

## 2022-12-15 NOTE — Anesthesia Procedure Notes (Signed)
Epidural Patient location during procedure: OB Start time: 12/15/2022 9:32 PM End time: 12/15/2022 9:39 PM  Staffing Anesthesiologist: Lannie Fields, DO Performed: anesthesiologist   Preanesthetic Checklist Completed: patient identified, IV checked, risks and benefits discussed, monitors and equipment checked, pre-op evaluation and timeout performed  Epidural Patient position: sitting Prep: DuraPrep and site prepped and draped Patient monitoring: continuous pulse ox, blood pressure, heart rate and cardiac monitor Approach: midline Location: L3-L4 Injection technique: LOR air  Needle:  Needle type: Tuohy  Needle gauge: 17 G Needle length: 9 cm Needle insertion depth: 8 cm Catheter type: closed end flexible Catheter size: 19 Gauge Catheter at skin depth: 13 cm Test dose: negative  Assessment Sensory level: T8 Events: blood not aspirated, no cerebrospinal fluid, injection not painful, no injection resistance, no paresthesia and negative IV test  Additional Notes Patient identified. Risks/Benefits/Options discussed with patient including but not limited to bleeding, infection, nerve damage, paralysis, failed block, incomplete pain control, headache, blood pressure changes, nausea, vomiting, reactions to medication both or allergic, itching and postpartum back pain. Confirmed with bedside nurse the patient's most recent platelet count. Confirmed with patient that they are not currently taking any anticoagulation, have any bleeding history or any family history of bleeding disorders. Patient expressed understanding and wished to proceed. All questions were answered. Sterile technique was used throughout the entire procedure. Please see nursing notes for vital signs. Test dose was given through epidural catheter and negative prior to continuing to dose epidural or start infusion. Warning signs of high block given to the patient including shortness of breath, tingling/numbness in  hands, complete motor block, or any concerning symptoms with instructions to call for help. Patient was given instructions on fall risk and not to get out of bed. All questions and concerns addressed with instructions to call with any issues or inadequate analgesia.  Reason for block:procedure for pain

## 2022-12-15 NOTE — Progress Notes (Addendum)
LABOR PROGRESS NOTE  Julia Sanders is a 22 y.o. G2P0010 at [redacted]w[redacted]d  admitted for IOL for pre E w/ SF(HA and visual changes)   Subjective: Comfortable but feeling contractions  Objective: BP 122/62   Pulse 89   Temp 98.2 F (36.8 C) (Oral)   Resp 18   Ht 6\' 1"  (1.854 m)   Wt 112.6 kg   SpO2 100%   BMI 32.75 kg/m  or  Vitals:   12/15/22 1801 12/15/22 1831 12/15/22 1905 12/15/22 1931  BP: (!) 105/57 135/79 125/73 122/62  Pulse: 86 85 99 89  Resp:      Temp:      TempSrc:      SpO2:      Weight:      Height:       Dilation: 5 Effacement (%): 60 Cervical Position: Posterior Station: -2 Presentation: Vertex Exam by:: m wilkins rnc FHT: baseline rate 135, moderate varibility, + acel, no decel Toco: every 2 min  Labs: Lab Results  Component Value Date   WBC 15.6 (H) 12/15/2022   HGB 10.3 (L) 12/15/2022   HCT 31.9 (L) 12/15/2022   MCV 84.2 12/15/2022   PLT 347 12/15/2022    Patient Active Problem List   Diagnosis Date Noted   Encounter for induction of labor 12/15/2022   Indication for care in labor or delivery 12/15/2022    Assessment / Plan: 22 y.o. G2P0010 at [redacted]w[redacted]d here for IOL for Pre E w/ SF  Labor: Progressing weel. S/p cytotec and FB. FB out. Will initiate pitocin Fetal Wellbeing:  cat 1 Pain Control:  patient planning epidural  Anticipated MOD:  vaginal  Pre E: BP mild range. Currently on magnesium. Continue mag and monitor BPS   Derrel Nip, MD  OB Fellow  12/15/2022, 7:42 PM

## 2022-12-15 NOTE — MAU Note (Signed)
Julia Sanders is a 22 y.o. at [redacted]w[redacted]d here in MAU reporting: been high risk since April, has been put on medications for BP. Has been having an increase in LLQ pain (worse when she is walking), increase in HA- has one now- has not taken anything for it, seeing white flashes at least once a day now. Denies increase in swelling. Had some spotting last night, none today. No LOF. Some FM, though has slowed down.   Onset of complaint: ongoing issue Pain score: HA 7, abd 8 Vitals:   12/15/22 1148  BP: (!) 149/92  Pulse: (!) 103  Resp: 18  Temp: 98.1 F (36.7 C)  SpO2: 100%     FHT:150 Lab orders placed from triage:  urine collected    Getting care in [place] Reports BP higher in rt arm than left

## 2022-12-15 NOTE — Anesthesia Preprocedure Evaluation (Signed)
Anesthesia Evaluation  Patient identified by MRN, date of birth, ID band Patient awake    Reviewed: Allergy & Precautions, Patient's Chart, lab work & pertinent test results  Airway Mallampati: II  TM Distance: >3 FB Neck ROM: Full    Dental no notable dental hx.    Pulmonary neg pulmonary ROS   Pulmonary exam normal breath sounds clear to auscultation       Cardiovascular hypertension (preE SF on mag), Normal cardiovascular exam Rhythm:Regular Rate:Normal     Neuro/Psych negative neurological ROS  negative psych ROS   GI/Hepatic negative GI ROS, Neg liver ROS,,,  Endo/Other  negative endocrine ROS    Renal/GU negative Renal ROS  negative genitourinary   Musculoskeletal negative musculoskeletal ROS (+)    Abdominal   Peds negative pediatric ROS (+)  Hematology  (+) Blood dyscrasia, anemia Hb 10.3, plt 347   Anesthesia Other Findings   Reproductive/Obstetrics (+) Pregnancy                             Anesthesia Physical Anesthesia Plan  ASA: 3  Anesthesia Plan: Epidural   Post-op Pain Management:    Induction:   PONV Risk Score and Plan: 2  Airway Management Planned: Natural Airway  Additional Equipment: None  Intra-op Plan:   Post-operative Plan:   Informed Consent: I have reviewed the patients History and Physical, chart, labs and discussed the procedure including the risks, benefits and alternatives for the proposed anesthesia with the patient or authorized representative who has indicated his/her understanding and acceptance.       Plan Discussed with:   Anesthesia Plan Comments:        Anesthesia Quick Evaluation

## 2022-12-16 ENCOUNTER — Encounter (HOSPITAL_COMMUNITY): Payer: Self-pay | Admitting: Family Medicine

## 2022-12-16 DIAGNOSIS — O1414 Severe pre-eclampsia complicating childbirth: Secondary | ICD-10-CM

## 2022-12-16 DIAGNOSIS — O99820 Streptococcus B carrier state complicating pregnancy: Secondary | ICD-10-CM

## 2022-12-16 DIAGNOSIS — Z3A36 36 weeks gestation of pregnancy: Secondary | ICD-10-CM

## 2022-12-16 DIAGNOSIS — O1410 Severe pre-eclampsia, unspecified trimester: Secondary | ICD-10-CM

## 2022-12-16 LAB — CBC WITH DIFFERENTIAL/PLATELET
Abs Immature Granulocytes: 0.1 10*3/uL — ABNORMAL HIGH (ref 0.00–0.07)
Basophils Absolute: 0.1 10*3/uL (ref 0.0–0.1)
Basophils Relative: 0 %
Eosinophils Absolute: 0.1 10*3/uL (ref 0.0–0.5)
Eosinophils Relative: 0 %
HCT: 30.9 % — ABNORMAL LOW (ref 36.0–46.0)
Hemoglobin: 10.1 g/dL — ABNORMAL LOW (ref 12.0–15.0)
Immature Granulocytes: 1 %
Lymphocytes Relative: 11 %
Lymphs Abs: 2 10*3/uL (ref 0.7–4.0)
MCH: 27.4 pg (ref 26.0–34.0)
MCHC: 32.7 g/dL (ref 30.0–36.0)
MCV: 84 fL (ref 80.0–100.0)
Monocytes Absolute: 1.1 10*3/uL — ABNORMAL HIGH (ref 0.1–1.0)
Monocytes Relative: 6 %
Neutro Abs: 14.4 10*3/uL — ABNORMAL HIGH (ref 1.7–7.7)
Neutrophils Relative %: 82 %
Platelets: 322 10*3/uL (ref 150–400)
RBC: 3.68 MIL/uL — ABNORMAL LOW (ref 3.87–5.11)
RDW: 13 % (ref 11.5–15.5)
WBC: 17.7 10*3/uL — ABNORMAL HIGH (ref 4.0–10.5)
nRBC: 0 % (ref 0.0–0.2)

## 2022-12-16 MED ORDER — ONDANSETRON HCL 4 MG/2ML IJ SOLN: 4.0000 mg | INTRAMUSCULAR | Status: DC | PRN

## 2022-12-16 MED ORDER — ACETAMINOPHEN 325 MG PO TABS: 650.0000 mg | ORAL_TABLET | ORAL | Status: DC | PRN

## 2022-12-16 MED ORDER — DIPHENHYDRAMINE HCL 25 MG PO CAPS: 25.0000 mg | ORAL_CAPSULE | Freq: Four times a day (QID) | ORAL | Status: DC | PRN

## 2022-12-16 MED ORDER — BENZOCAINE-MENTHOL 20-0.5 % EX AERO: 1.0000 | INHALATION_SPRAY | CUTANEOUS | Status: DC | PRN

## 2022-12-16 MED ORDER — SENNOSIDES-DOCUSATE SODIUM 8.6-50 MG PO TABS
2.0000 | ORAL_TABLET | ORAL | Status: DC
  Administered 2022-12-16 – 2022-12-18 (×3): 2 via ORAL
  Filled 2022-12-16 (×3): qty 2

## 2022-12-16 MED ORDER — BISACODYL 10 MG RE SUPP: 10.0000 mg | Freq: Every day | RECTAL | Status: DC | PRN

## 2022-12-16 MED ORDER — FLEET ENEMA 7-19 GM/118ML RE ENEM: 1.0000 | ENEMA | Freq: Every day | RECTAL | Status: DC | PRN

## 2022-12-16 MED ORDER — DIBUCAINE (PERIANAL) 1 % EX OINT: 1.0000 | TOPICAL_OINTMENT | CUTANEOUS | Status: DC | PRN

## 2022-12-16 MED ORDER — PRENATAL MULTIVITAMIN CH
1.0000 | ORAL_TABLET | Freq: Every day | ORAL | Status: DC
  Administered 2022-12-16 – 2022-12-18 (×3): 1 via ORAL
  Filled 2022-12-16 (×3): qty 1

## 2022-12-16 MED ORDER — COCONUT OIL OIL: 1.0000 | TOPICAL_OIL | Status: DC | PRN

## 2022-12-16 MED ORDER — TRANEXAMIC ACID-NACL 1000-0.7 MG/100ML-% IV SOLN
1000.0000 mg | Freq: Once | INTRAVENOUS | Status: AC
  Administered 2022-12-16: 1000 mg via INTRAVENOUS

## 2022-12-16 MED ORDER — IBUPROFEN 600 MG PO TABS
600.0000 mg | ORAL_TABLET | Freq: Four times a day (QID) | ORAL | Status: DC
  Administered 2022-12-16 – 2022-12-18 (×10): 600 mg via ORAL
  Filled 2022-12-16 (×10): qty 1

## 2022-12-16 MED ORDER — WITCH HAZEL-GLYCERIN EX PADS: 1.0000 | MEDICATED_PAD | CUTANEOUS | Status: DC | PRN

## 2022-12-16 MED ORDER — METHYLERGONOVINE MALEATE 0.2 MG/ML IJ SOLN: 0.2000 mg | INTRAMUSCULAR | Status: DC | PRN

## 2022-12-16 MED ORDER — METHYLERGONOVINE MALEATE 0.2 MG PO TABS: 0.2000 mg | ORAL_TABLET | ORAL | Status: DC | PRN

## 2022-12-16 MED ORDER — ONDANSETRON HCL 4 MG PO TABS: 4.0000 mg | ORAL_TABLET | ORAL | Status: DC | PRN

## 2022-12-16 MED ORDER — SIMETHICONE 80 MG PO CHEW: 80.0000 mg | CHEWABLE_TABLET | ORAL | Status: DC | PRN

## 2022-12-16 MED ORDER — MEASLES, MUMPS & RUBELLA VAC IJ SOLR: 0.5000 mL | Freq: Once | INTRAMUSCULAR | Status: DC

## 2022-12-16 MED ORDER — FERROUS SULFATE 325 (65 FE) MG PO TABS
325.0000 mg | ORAL_TABLET | ORAL | Status: DC
  Administered 2022-12-16 – 2022-12-18 (×2): 325 mg via ORAL
  Filled 2022-12-16 (×2): qty 1

## 2022-12-16 MED ORDER — TETANUS-DIPHTH-ACELL PERTUSSIS 5-2.5-18.5 LF-MCG/0.5 IM SUSY: 0.5000 mL | PREFILLED_SYRINGE | Freq: Once | INTRAMUSCULAR | Status: DC

## 2022-12-16 MED ORDER — TRANEXAMIC ACID-NACL 1000-0.7 MG/100ML-% IV SOLN
INTRAVENOUS | Status: AC
  Filled 2022-12-16: qty 100

## 2022-12-16 MED ORDER — FUROSEMIDE 20 MG PO TABS
20.0000 mg | ORAL_TABLET | Freq: Every day | ORAL | Status: DC
  Administered 2022-12-16 – 2022-12-18 (×3): 20 mg via ORAL
  Filled 2022-12-16 (×3): qty 1

## 2022-12-16 MED ORDER — MEDROXYPROGESTERONE ACETATE 150 MG/ML IM SUSP: 150.0000 mg | INTRAMUSCULAR | Status: DC | PRN

## 2022-12-16 MED ORDER — MISOPROSTOL 200 MCG PO TABS
600.0000 ug | ORAL_TABLET | Freq: Once | ORAL | Status: AC
  Administered 2022-12-16: 600 ug via RECTAL
  Filled 2022-12-16: qty 3

## 2022-12-16 NOTE — Lactation Note (Signed)
This note was copied from a baby's chart.  NICU Lactation Consultation Note  Patient Name: Julia Sanders [id number] Date: 12/16/2022 Age:23 hours  Reason for consult: Initial assessment; NICU baby; Late-preterm 34-36.6wks; Primapara; 1st time breastfeeding; Other (Comment) (Maternal pre-eclampsia on MgSO4)  SUBJECTIVE  LC visited with P1 Mom of LPTI delivered vaginally and admitted to NICU for respiratory support (on CPAP).  Mom on MgSO4 and very sleepy, resting on her side.  With permission, LC demonstrated breast massage and hand expression and encouraged Mom to do this often.  Mom is too exhausted to start pumping, but process reviewed briefly.  FOB sleeping also. Mom aware to ask for help prn.  LC sized Mom to use 21 mm flanges for pumping.  Mom does not have a home pump.  LC to submit a STORK request.  OBJECTIVE Infant data: No data recorded Infant feeding assessment No data recorded  Maternal data: G2P0111  Vaginal, Spontaneous Has patient been taught Hand Expression?: Yes Hand Expression Comments: Briefly demonstrated this, no colostrum noted at present Significant Breast History:: + breast changes Current breast feeding challenges:: NICU admission Does the patient have breastfeeding experience prior to this delivery?: No Pumping frequency: Encouraged to pump every 3 hrs or 8 times per 24 hrs Flange Size: 21 Risk factor for low milk supply:: Infant separation  Pump:  (Submitting a STORK pump request)  ASSESSMENT Infant: Feeding Status: NPO  Maternal: No data recorded INTERVENTIONS/PLAN Interventions: Interventions: Breast feeding basics reviewed; Skin to skin; Breast massage; Hand express; DEBP; LC Services brochure Tools: Pump; Flanges Pump Education: Setup, frequency, and cleaning; Milk Storage  Plan: Consult Status: NICU follow-up NICU Follow-up type: New admission follow up   Judee Clara 12/16/2022, 9:16 AM

## 2022-12-16 NOTE — Progress Notes (Signed)
At about 20 minutes of life infant was taken to warmer for persistent grunting. Upon visual assessment labored breathing with retractions were noted. O2 sat reading 88-95%. CPAP was initiated at this time. At 27 minutes of life NICU team was called as labored breathing and grunting were unchanged with CPAP.

## 2022-12-16 NOTE — Anesthesia Postprocedure Evaluation (Signed)
Anesthesia Post Note  Patient: Julia Sanders  Procedure(s) Performed: AN AD HOC LABOR EPIDURAL     Patient location during evaluation: Mother Baby Anesthesia Type: Epidural Level of consciousness: awake and alert Pain management: pain level controlled Vital Signs Assessment: post-procedure vital signs reviewed and stable Respiratory status: spontaneous breathing, nonlabored ventilation and respiratory function stable Cardiovascular status: stable Postop Assessment: no headache, no backache and epidural receding Anesthetic complications: no   No notable events documented.  Last Vitals:  Vitals:   12/16/22 0543 12/16/22 0643  BP: 134/81 117/81  Pulse: 76 74  Resp: 16 14  Temp: 36.5 C   SpO2: 100%     Last Pain:  Vitals:   12/16/22 0543  TempSrc: Oral  PainSc: 2    Pain Goal:                   Harun Brumley

## 2022-12-16 NOTE — Discharge Summary (Signed)
Postpartum Discharge Summary  Date of Service updated***     Patient Name: Julia Sanders DOB: 10/31/2000 MRN: [id number]  Date of admission: 12/15/2022 Delivery date:12/16/2022  Delivering provider: Jacklyn Shell  Date of discharge: 12/16/2022  Admitting diagnosis: Encounter for induction of labor [Z34.90] Indication for care in labor or delivery [O75.9] Intrauterine pregnancy: [redacted]w[redacted]d     Secondary diagnosis:  Principal Problem:   Encounter for induction of labor Active Problems:   Indication for care in labor or delivery   Severe preeclampsia  Additional problems: ***    Discharge diagnosis: Preterm Pregnancy Delivered and Preeclampsia (severe)                                              Post partum procedures:{Postpartum procedures:23558} Augmentation: AROM, Pitocin, Cytotec, and IP Foley Complications: None  Hospital course: Induction of Labor With Vaginal Delivery   22 y.o. yo G2P0010 at [redacted]w[redacted]d was admitted to the hospital 12/15/2022 for induction of labor.  Indication for induction: Preeclampsia.  Patient had an labor course complicated by nothing Membrane Rupture Time/Date: 10:40 PM ,12/15/2022   Delivery Method:Vaginal, Spontaneous  Episiotomy: None  Lacerations:  1st degree  Details of delivery can be found in separate delivery note.  Patient had a postpartum course complicated by***. Patient is discharged home 12/16/22.  Newborn Data: Birth date:12/16/2022  Birth time:2:12 AM  Gender:Female  Living status:Living  Apgars:8 ,9  Weight:   Magnesium Sulfate received: Yes: Seizure prophylaxis BMZ received: No Rhophylac:N/A MMR:N/A T-DaP:{Tdap:23962} Flu: {NWG:95621} Transfusion:{Transfusion received:30440034}  Physical exam  Vitals:   12/16/22 0030 12/16/22 0100 12/16/22 0130 12/16/22 0230  BP: (!) 105/55 139/72 132/76 (!) 139/56  Pulse: 71 94 95 (!) 102  Resp:      Temp:      TempSrc:      SpO2:      Weight:      Height:       General:  {Exam; general:21111117} Lochia: {Desc; appropriate/inappropriate:30686::"appropriate"} Uterine Fundus: {Desc; firm/soft:30687} Incision: {Exam; incision:21111123} DVT Evaluation: {Exam; dvt:2111122} Labs: Lab Results  Component Value Date   WBC 15.6 (H) 12/15/2022   HGB 10.3 (L) 12/15/2022   HCT 31.9 (L) 12/15/2022   MCV 84.2 12/15/2022   PLT 347 12/15/2022      Latest Ref Rng & Units 12/15/2022    1:07 PM  CMP  Glucose 70 - 99 mg/dL 308   BUN 6 - 20 mg/dL 5   Creatinine 6.57 - 8.46 mg/dL 9.62   Sodium 952 - 841 mmol/L 134   Potassium 3.5 - 5.1 mmol/L 3.6   Chloride 98 - 111 mmol/L 104   CO2 22 - 32 mmol/L 20   Calcium 8.9 - 10.3 mg/dL 8.7   Total Protein 6.5 - 8.1 g/dL 6.3   Total Bilirubin 0.3 - 1.2 mg/dL 0.4   Alkaline Phos 38 - 126 U/L 102   AST 15 - 41 U/L 17   ALT 0 - 44 U/L 6    Edinburgh Score:     No data to display           After visit meds:  Allergies as of 12/16/2022   No Known Allergies   Med Rec must be completed prior to using this [facility]***        Discharge home in stable condition Infant Feeding: {Baby feeding:23562} Infant Disposition:{CHL IP OB HOME  WITH ZOXWRU:04540} Discharge instruction: per After Visit Summary and Postpartum booklet. Activity: Advance as tolerated. Pelvic rest for 6 weeks.  Diet: {OB JWJX:91478295} Future Appointments:No future appointments. Follow up Visit:   Please schedule this patient for a In person postpartum visit in 4 weeks with the following provider: Any provider. Additional Postpartum F/U:BP check 1 week  (virtual) Low risk pregnancy complicated by: HTN Delivery mode:  Vaginal, Spontaneous  Anticipated Birth Control:  Unsure   12/16/2022 Jacklyn Shell, CNM

## 2022-12-17 NOTE — Progress Notes (Signed)
Post Partum Day 1 Subjective: no complaints  Objective: Blood pressure 111/74, pulse 76, temperature 98.5 F (36.9 C), temperature source Oral, resp. rate 16, height 6\' 1"  (1.854 m), weight 112.6 kg, SpO2 98 %, unknown if currently breastfeeding.  Physical Exam:  General: alert, cooperative, and no distress Lochia: appropriate Uterine Fundus: firm Incision:  DVT Evaluation: No evidence of DVT seen on physical exam.  Recent Labs    12/15/22 1846 12/16/22 0343  HGB 10.3* 10.1*  HCT 31.9* 30.9*    Assessment/Plan: Breastfeeding Baby in NICU intubated. I saw the patient in the baby's room  LOS: 2 days   Scheryl Darter, MD 12/17/2022, 12:10 PM

## 2022-12-17 NOTE — Lactation Note (Signed)
This note was copied from a baby's chart.  NICU Lactation Consultation Note  Patient Name: Julia Sanders ZOXWR'U Date: 12/17/2022 Age:24 hours  Reason for consult: Follow-up assessment; 1st time breastfeeding; Primapara; NICU baby; Late-preterm 34-36.6wks  SUBJECTIVE  LC in to visit with P1 Mom of LPTI in the NICU.  Baby was intubated and is NPO at present. Mom has been pumping and concerned she isn't seeing any colostrum.  Encouraged breast massage and hand expression and consistent pumping.  Reviewed normal breast milk volumes and how it can take 3-6 days for breasts to fill up and Mom to be able to express volume.  Mom received her STORK pump. Provided a hand's free pumping band, RN to assist with this at  next pumping as Mom declined LC's help.  She wanted to dry her hair at present, following her shower.  OBJECTIVE Infant data: No data recorded Infant feeding assessment No data recorded  Maternal data: G2P0111  Vaginal, Spontaneous Has patient been taught Hand Expression?: Yes Hand Expression Comments: Briefly demonstrated this, no colostrum noted at present Significant Breast History:: + breast changes Current breast feeding challenges:: NICU admission Does the patient have breastfeeding experience prior to this delivery?: No Pumping frequency: Encouraged to pump 8 times per 24 hrs, or every 2-3  hrs when awake Pumped volume: 0 mL Flange Size: 21 Risk factor for low milk supply:: Infant separation  Pump: Stork Pump  ASSESSMENT Infant: Feeding Status: NPO  Maternal: Milk volume: Normal  INTERVENTIONS/PLAN Interventions: Interventions: Breast feeding basics reviewed; Skin to skin; Breast massage; Hand express; DEBP; Education Tools: Pump; Flanges; Hands-free pumping top Pump Education: Setup, frequency, and cleaning  Plan: Consult Status: NICU follow-up NICU Follow-up type: Verify onset of copious milk; Verify absence of engorgement   Julia Sanders 12/17/2022, 10:04 AM

## 2022-12-17 NOTE — Progress Notes (Signed)
Patient screened out for psychosocial assessment since none of the following apply:  Psychosocial stressors documented in mother or baby's chart  Gestation less than 32 weeks  Code at delivery   Infant with anomalies Please contact the Clinical Social Worker if specific needs arise, by MOB's request, or if MOB scores greater than 9/yes to question 10 on Edinburgh Postpartum Depression Screen.  Jozef Eisenbeis, LCSW Clinical Social Worker [facility] Cell#: [phone]     

## 2022-12-18 ENCOUNTER — Other Ambulatory Visit (HOSPITAL_COMMUNITY): Payer: Self-pay

## 2022-12-18 MED ORDER — IBUPROFEN 600 MG PO TABS
600.0000 mg | ORAL_TABLET | Freq: Four times a day (QID) | ORAL | 0 refills | Status: DC | PRN
  Filled 2022-12-18: qty 30, 8d supply, fill #0

## 2022-12-18 MED ORDER — PRENATAL MULTIVITAMIN CH: 1.0000 | ORAL_TABLET | Freq: Every day | ORAL | Status: DC

## 2022-12-18 MED ORDER — ACETAMINOPHEN 325 MG PO TABS: 650.0000 mg | ORAL_TABLET | ORAL | Status: AC | PRN

## 2022-12-18 MED ORDER — FUROSEMIDE 20 MG PO TABS
20.0000 mg | ORAL_TABLET | Freq: Every day | ORAL | 0 refills | Status: DC
  Filled 2022-12-18: qty 3, 3d supply, fill #0

## 2022-12-18 MED ORDER — FERROUS SULFATE 325 (65 FE) MG PO TABS
325.0000 mg | ORAL_TABLET | ORAL | 0 refills | Status: DC
  Filled 2022-12-18: qty 100, 200d supply, fill #0

## 2022-12-18 NOTE — Progress Notes (Signed)
Discharge instructions and prescriptions given to pt. Discussed post vaginal delivery care, signs and symptoms to report to the MD, upcoming appointments, and meds.Pt verbalizes understanding and has no questions or concerns at this time. Pt discharged home from hospital in stable condition. 

## 2022-12-18 NOTE — Discharge Instructions (Signed)
Continue to check your blood pressures twice a day Call the office for blood pressures that are consistently above 140 for the top number or 90 for the bottom number   Hypertension During Pregnancy Hypertension is also called high blood pressure. High blood pressure means that the force of your blood moving in your body is too strong. It can cause problems for you and your baby. Different types of high blood pressure can happen during pregnancy. The types are: High blood pressure before you got pregnant. This is called chronic hypertension.  This can continue during your pregnancy. Your doctor will want to keep checking your blood pressure. You may need medicine to keep your blood pressure under control while you are pregnant. You will need follow-up visits after you have your baby. High blood pressure that goes up during pregnancy when it was normal before. This is called gestational hypertension. It will usually get better after you have your baby, but your doctor will need to watch your blood pressure to make sure that it is getting better. Very high blood pressure during pregnancy. This is called preeclampsia. Very high blood pressure is an emergency that needs to be checked and treated right away. You may develop very high blood pressure after giving birth. This is called postpartum preeclampsia. This usually occurs within 48 hours after childbirth but may occur up to 6 weeks after giving birth. This is rare. How does this affect me? If you have high blood pressure during pregnancy, you have a higher chance of developing high blood pressure: As you get older. If you get pregnant again. In some cases, high blood pressure during pregnancy can cause: Stroke. Heart attack. Damage to the kidneys, lungs, or liver. Preeclampsia. Jerky movements you cannot control (convulsions or seizures). Problems with the placenta.  What can I do to lower my risk?  Keep a healthy weight. Eat a healthy  diet. Follow what your doctor tells you about treating any medical problems that you had before becoming pregnant. It is very important to go to all of your doctor visits. Your doctor will check your blood pressure and make sure that your pregnancy is progressing as it should. Treatment should start early if a problem is found.  Follow these instructions at home:  Take your blood pressure 1-2 times per day. Call the office if your blood pressure is 155 or higher for the top number or 105 or higher for the bottom number.    Eating and drinking  Drink enough fluid to keep your pee (urine) pale yellow. Avoid caffeine. Lifestyle Do not use any products that contain nicotine or tobacco, such as cigarettes, e-cigarettes, and chewing tobacco. If you need help quitting, ask your doctor. Do not use alcohol or drugs. Avoid stress. Rest and get plenty of sleep. Regular exercise can help. Ask your doctor what kinds of exercise are best for you. General instructions Take over-the-counter and prescription medicines only as told by your doctor. Keep all prenatal and follow-up visits as told by your doctor. This is important. Contact a doctor if: You have symptoms that your doctor told you to watch for, such as: Headaches. Nausea. Vomiting. Belly (abdominal) pain. Dizziness. Light-headedness. Get help right away if: You have: Very bad belly pain that does not get better with treatment. A very bad headache that does not get better. Vomiting that does not get better. Sudden, fast weight gain. Sudden swelling in your hands, ankles, or face. Blood in your pee. Blurry vision. Double vision.   Shortness of breath. Chest pain. Weakness on one side of your body. Trouble talking. Summary High blood pressure is also called hypertension. High blood pressure means that the force of your blood moving in your body is too strong. High blood pressure can cause problems for you and your baby. Keep all  follow-up visits as told by your doctor. This is important. This information is not intended to replace advice given to you by your health care provider. Make sure you discuss any questions you have with your health care provider. Document Released: 08/23/2010 Document Revised: 11/11/2018 Document Reviewed: 08/17/2018 Elsevier Patient Education  2020 [employer] 

## 2022-12-19 ENCOUNTER — Ambulatory Visit (HOSPITAL_COMMUNITY): Payer: Self-pay

## 2022-12-19 NOTE — Lactation Note (Signed)
This note was copied from a baby's chart.  NICU Lactation Consultation Note  Patient Name: Boy Sofiyah Riesen ZOXWR'U Date: 12/19/2022 Age:24 days  Reason for consult: Follow-up assessment; Primapara; 1st time breastfeeding; Maternal discharge; Early term 66-38.6wks; NICU baby  SUBJECTIVE Visited with family of 49 hours old ETI NICU female; Ms. Saler is a P1 and reports she's pumping but not consistently. Explained the importance of consistent pumping for the prevention of engorgement and to protect her supply, her milk is in. Her plan is to take baby to breast once he's ready along with pumping and bottle feeding with breastmilk and formula. She got discharged yesterday. Reviewed discharge education, engorgement prevention/treatment, lactogenesis II, pumping schedule, pump settings and anticipatory guidelines.  OBJECTIVE Infant data: Mother's Current Feeding Choice: Breast Milk and Formula  Infant feeding assessment Scale for Readiness: 5   Maternal data: [id number]  Vaginal, Spontaneous Pumping frequency: 2 times/24 hours Pumped volume: 30 mL Flange Size: 21 Risk factor for low milk supply:: primipara, prematurity, infant separation  Pump: Stork Pump  ASSESSMENT Infant: Feeding Status: Scheduled 8-11-2-5  Maternal: Milk volume: Low  INTERVENTIONS/PLAN Interventions: Interventions: Breast feeding basics reviewed; DEBP; Coconut oil; Education Discharge Education: Engorgement and breast care Tools: Pump; Flanges; Coconut oil; Hands-free pumping top Pump Education: Setup, frequency, and cleaning; Milk Storage  Plan: Encouraged pumping on maintenance mode every 3 hours, ideally 8 pumping sessions/24 hours She'll call for latch assistance when baby is ready to go to breast  FOB present. All questions and concerns answered, family to contact [employer] services PRN.  Consult Status: NICU follow-up NICU Follow-up type: Weekly NICU follow up; Verify absence of  engorgement   Ayman Brull S Maxx Pham 12/19/2022, 4:35 PM

## 2022-12-23 ENCOUNTER — Telehealth: Payer: 59

## 2022-12-24 ENCOUNTER — Telehealth: Payer: 59

## 2022-12-25 ENCOUNTER — Other Ambulatory Visit: Payer: Self-pay | Admitting: Obstetrics & Gynecology

## 2022-12-25 ENCOUNTER — Telehealth (HOSPITAL_COMMUNITY): Payer: Self-pay | Admitting: *Deleted

## 2022-12-25 ENCOUNTER — Telehealth: Payer: Self-pay | Admitting: *Deleted

## 2022-12-25 MED ORDER — NIFEDIPINE ER OSMOTIC RELEASE 30 MG PO TB24: 30.0000 mg | ORAL_TABLET | Freq: Every day | ORAL | 1 refills | Status: DC

## 2022-12-25 NOTE — Telephone Encounter (Signed)
Hospital Discharge Follow-Up Call:  Patient reports that she is worried about her BP, her reading this morning was 140/104. She has had a migraine for two days and has begun seeing light spots in her vision.  She notified her provider this morning and provider sent a prescription to her pharmacy.  Patient was at pharmacy during call to pick up the medicine.  She said that provider told her to come to MAU if the medication does not lower her BP.  Encouraged patient to take a dose of the medicine and see if it helps over the next few hours.  Patient also expressing concern about whether she should come to MAU now as she is afraid that the "severe preeclampsia has come back".  Advised that she could come to MAU anytime for evaluation if she is worried.  EPDS today was 0 and patient endorses this accurately reflects that she is doing well emotionally.

## 2022-12-25 NOTE — Telephone Encounter (Signed)
Patient called with c/o a migraine, and seeing spots the last couple of days.  Had GHTN during the pregnancy, was on Labetalol but was discharged with normal blood pressures and no meds.  BP last night 150/99 and today 134/104.  Discussed with Dr Despina Hidden and will start on Procardia 30mg  daily.  Will need to have bp check next week.  Advised patient if blood pressure continues to stay elevated, will need to go to MAU for evaluation.Pt verbalized understanding.

## 2022-12-30 ENCOUNTER — Ambulatory Visit (HOSPITAL_COMMUNITY): Payer: Self-pay

## 2022-12-30 NOTE — Lactation Note (Signed)
This note was copied from a baby's chart.  NICU Lactation Consultation Note  Patient Name: Julia Sanders [id number] Date: 12/30/2022 Age:24 wk.o.  Reason for consult: Weekly NICU follow-up; Primapara; 1st time breastfeeding; NICU baby; Early term 64-38.6wks; Breastfeeding assistance  SUBJECTIVE Visited with family of 27 36/69 weeks old AGA NICU female; [name] is a P1 and reports she's pumping but still not consistently. 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 Explained the importance of consistent pumping to protect her supply and for the prevention of engorgement. NICU RN Whitney Post called this LC to assist with the first latch for the 5 pm feeding, baby already awake, alert and showing feeding cues. This LC took baby " [name]" to the L side in cross cradle hold and he was able to latch but unable to sustain it until NS # 20 was placed. Timed with feeding with the stopwatch and he did a total of 15 minutes, with 5 minutes of net NS patterns; NNS was the predominant pattern during this feeding. Asked RN Whitney Post to do a full gavage feeding per IDF protocol. Reviewed pumping schedule, lactogenesis III, IDF 1/2 and anticipatory guidelines.  OBJECTIVE Infant data: Mother's Current Feeding Choice: Breast Milk and Formula  Infant feeding assessment Scale for Readiness: 1 Scale for Quality: 2   Maternal data: [id number]  Vaginal, Spontaneous Pumping frequency: 3 times/24 hours Pumped volume: 120 mL (120-180 ml) Flange Size: 21  Pump: Stork Pump  ASSESSMENT Infant: LATCH Documentation Latch: 1 (With NS # 20, baby kept slipping off the breast without it) Audible Swallowing: 1 (with and without compression, NS and NNS patterns noted being NNS predominant) Type of Nipple: 2 Comfort (Breast/Nipple): 2 Hold (Positioning): 1 LATCH Score: 7  Feeding Status: Scheduled 8-11-2-5  Maternal: Milk volume: Low  INTERVENTIONS/PLAN Interventions: Interventions: Breast  feeding basics reviewed; Assisted with latch; Skin to skin; Breast compression; Adjust position; Support pillows; Coconut oil; Hand pump; DEBP; Education; Infant Driven Feeding Algorithm education Tools: Pump; Flanges; Coconut oil; Hands-free pumping top; Nipple Dorris Carnes Pump Education: Setup, frequency, and cleaning; Milk Storage Nipple shield size: 20  Plan: Encouraged to start pumping consistently on maintenance mode every 3 hours, ideally 8 pumping sessions/24 hours She'll start putting baby to breast on feeding cues around feeding times using NS # 20 PRN and will call for assistance when needed   No other support person at this time. All questions and concerns answered, family to contact [facility] services PRN.   Consult Status: NICU follow-up NICU Follow-up type: Weekly NICU follow up; Assist with IDF-2 (Mother does not need to pre-pump before breastfeeding)   Julia Sanders Julia Sanders 12/30/2022, 5:48 PM

## 2022-12-31 ENCOUNTER — Telehealth: Payer: 59

## 2023-01-04 ENCOUNTER — Ambulatory Visit (HOSPITAL_COMMUNITY): Payer: Self-pay

## 2023-01-04 NOTE — Lactation Note (Signed)
This note was copied from a baby's chart.  NICU Lactation Consultation Note  Patient Name: Boy Beily Buscemi [id number] Date: 01/04/2023 Age:24 wk.o.  Reason for consult: Weekly NICU follow-up; Primapara; 1st time breastfeeding; NICU baby; Term  SUBJECTIVE Visited with family of 42 53/89 weeks old AGA NICU female; Ms. Cadwell is a P1 and reports that her pumping status is about the same as it was the last time she saw lactation. Parents are taking baby "[name]" home today. Reviewed discharge education and the importance of consistent pumping after feedings/attempts at the breast but she voiced that her plan will be to exclusively pump and bottle feed with breastmilk/formula after discharge. She politely declined an LC OP referral but she's aware that she can still contact us via NICU LC phone line. FOB present, all questions and concerns answered, family to contact [facility] services PRN.  OBJECTIVE Infant data: Mother's Current Feeding Choice: Breast Milk and Formula  Infant feeding assessment Scale for Readiness: 1 Scale for Quality: 2   Maternal data: [id number]  Vaginal, Spontaneous Pumping frequency: 3 times/24 hours Pumped volume: 120 mL (120-180 ml) Flange Size: 21  Pump: Stork Pump  ASSESSMENT Infant: Feeding Status: Ad lib  Maternal: Milk volume: Low  INTERVENTIONS/PLAN Interventions: Interventions: Breast feeding basics reviewed; DEBP; Education Discharge Education: Outpatient recommendation Tools: Pump; Flanges Pump Education: Setup, frequency, and cleaning; Milk Storage  Plan: Consult Status: Complete   Erma Joubert S Isidore Margraf 01/04/2023, 11:54 AM

## 2023-01-15 ENCOUNTER — Ambulatory Visit: Payer: 59 | Admitting: Advanced Practice Midwife

## 2023-01-15 ENCOUNTER — Encounter: Payer: Self-pay | Admitting: Advanced Practice Midwife

## 2023-01-15 MED ORDER — NORETHIN-ETH ESTRAD-FE BIPHAS 1 MG-10 MCG / 10 MCG PO TABS: 1.0000 | ORAL_TABLET | Freq: Every day | ORAL | 11 refills | Status: DC

## 2023-01-15 NOTE — Progress Notes (Signed)
Post Partum Visit Note   Chief Complaint:   Postpartum Care  History of Present Illness:   Julia Sanders is a 22 y.o. [phone] Caucasian female being seen today for a postpartum visit. She is 4 weeks postpartum following a spontaneous vaginal delivery at 36.5 gestational weeks. IOL: Yes, for Preeclampsia. Anesthesia: epidural.  Laceration: 1st degree.  Complications: none. Inpatient contraception: no.   Pregnancy complicated by severe Preeclampsia . Tobacco use: no. Substance use disorder: no. Last pap smear: 06/30/22 and results were NILM w/ HRHPV not done. Next pap smear due: 2026 Patient's last menstrual period was 12/19/2022.  Postpartum course Called office w/elevated BPs on 5/23,started on Procardia.  Last dose this morning. Bleeding no bleeding. Bowel function is normal. Bladder function is normal. Urinary incontinence? No, fecal incontinence? No Patient is sexually active. Last sexual activity: 01/09/23.    Upstream - 01/15/23 1516       Pregnancy Intention Screening   Does the patient want to become pregnant in the next year? No    Does the patient's partner want to become pregnant in the next year? No    Would the patient like to discuss contraceptive options today? No      Contraception Wrap Up   Current Method Oral Contraceptive    End Method Oral Contraceptive    Contraception Counseling Provided No            The pregnancy intention screening data noted above was reviewed. Potential methods of contraception were discussed. The patient elected to proceed with Oral Contraceptive.  Edinburgh Postpartum Depression Screening: Negative  Edinburgh Postnatal Depression Scale - 01/15/23 1514       Edinburgh Postnatal Depression Scale:  In the Past 7 Days   I have been able to laugh and see the funny side of things. 0    I have looked forward with enjoyment to things. 0    I have blamed myself unnecessarily when things went wrong. 0    I have been anxious or worried  for no good reason. 0    I have felt scared or panicky for no good reason. 0    Things have been getting on top of me. 0    I have been so unhappy that I have had difficulty sleeping. 0    I have felt sad or miserable. 0    I have been so unhappy that I have been crying. 0    The thought of harming myself has occurred to me. 0    Edinburgh Postnatal Depression Scale Total 0            Baby's course has been uncomplicated. Baby is feeding by bottle. Infant has a pediatrician/family doctor? Yes.  Childcare strategy if returning to work/school: yes.  Pt has material needs met for her and baby: Yes.   Review of Systems:   Pertinent items are noted in HPI Denies Abnormal vaginal discharge w/ itching/odor/irritation, headaches, visual changes, shortness of breath, chest pain, abdominal pain, severe nausea/vomiting, or problems with urination or bowel movements. Pertinent History Reviewed:  Reviewed past medical,surgical, obstetrical and family history.  Reviewed problem list, medications and allergies. OB History  Gravida Para Term Preterm AB Living  2 1   1 1 1   SAB IAB Ectopic Multiple Live Births  1     0 1    # Outcome Date GA Lbr Len/2nd Weight Sex Delivery Anes PTL Lv  2 Preterm 12/16/22 [redacted]w[redacted]d 02:20 / 01:12 7 lb 0.5 oz (  3.19 kg) M Vag-Spont EPI  LIV  1 SAB            Physical Assessment:   Vitals:   01/15/23 1507 01/15/23 1532  BP: (!) 127/90 120/84  Pulse: 87 89  Weight: 234 lb (106.1 kg)   Height: 6\' 2"  (1.88 m)   Body mass index is 30.04 kg/m.  Objective:  Blood pressure 120/84, pulse 89, height 6\' 2"  (1.88 m), weight 234 lb (106.1 kg), last menstrual period 12/19/2022, not currently breastfeeding.  General:  alert, cooperative, and no distress   Breasts:  negative  Lungs: Normal respiratory effort  Heart:  regular rate and rhythm  Abdomen: soft, non-tender,    Vulva:  normal  Vagina: normal vagina  Cervix:  normal  Corpus: Well involuted  Adnexa:  not  evaluated  Rectal Exam: no hemorrhoids          No results found for this or any previous visit (from the past 24 hour(s)).  Assessment & Plan:  1) Postpartum exam 2) 4 wks s/p spontaneous vaginal delivery 3) bottle feeding 4) Depression screening 5) Contraception management: rx COCs  Essential components of care per ACOG recommendations:  1.  Mood and well being:  If positive depression screen, discussed and plan developed.  If using tobacco we discussed reduction/cessation and risk of relapse If current substance abuse, we discussed and referral to local resources was offered.   2. Infant care and feeding:  If breastfeeding, discussed returning to work, pumping, breastfeeding-associated pain, guidance regarding return to fertility while lactating if not using another method. If needed, patient was provided with a letter to be allowed to pump q 2-3hrs to support lactation in a private location with access to a refrigerator to store breastmilk.   Recommended that all caregivers be immunized for flu, pertussis and other preventable communicable diseases If pt does not have material needs met for her/baby, referred to local resources for help obtaining these.  3. Sexuality, contraception and birth spacing Provided guidance regarding sexuality, management of dyspareunia, and resumption of intercourse Discussed avoiding interpregnancy interval <53mths and recommended birth spacing of 18 months  4. Sleep and fatigue Discussed coping options for fatigue and sleep disruption Encouraged family/partner/community support of 4 hrs of uninterrupted sleep to help with mood and fatigue  5. Physical recovery  If pt had a C/S, assessed incisional pain and providing guidance on normal vs prolonged recovery If pt had a laceration, perineal healing and pain reviewed.  If urinary or fecal incontinence, discussed management and referred to PT or uro/gyn if indicated  Patient is safe to resume physical  activity. Discussed attainment of healthy weight.  6.  Chronic disease management Discussed pregnancy complications if any, and their implications for future childbearing and long-term maternal health. Review recommendations for prevention of recurrent pregnancy complications, such as aspirin to reduce risk of preeclampsia yes. Pt had GDM: No. If yes, 2hr GTT scheduled: not applicable. Reviewed medications and non-pregnant dosing including consideration of whether pt is breastfeeding using a reliable resource such as LactMed: not applicable Referred for f/u w/ PCP or subspecialist providers as indicated: not applicable  7. Health maintenance Mammogram at 24yo or earlier if indicated Pap smears as indicated  Meds:  Meds ordered this encounter  Medications   Norethindrone-Ethinyl Estradiol-Fe Biphas (LO LOESTRIN FE) 1 MG-10 MCG / 10 MCG tablet    Sig: Take 1 tablet by mouth daily.    Dispense:  28 tablet    Refill:  11  Order Specific Question:   Supervising Provider    Answer:   Lazaro Arms [[id number]]   Start when baby turns 74 weeks old; BU for 1 month unless starts on day 1-2 of period   Follow-up: Return in about 2 weeks (around 01/29/2023) for mychart video RN BP check.   No orders of the defined types were placed in this encounter.      Jacklyn Shell DNP, CNM [facility] [employer], [facility] 01/15/2023 3:43 PM

## 2023-01-21 ENCOUNTER — Encounter: Payer: Self-pay | Admitting: *Deleted

## 2023-01-29 ENCOUNTER — Telehealth: Payer: 59 | Admitting: *Deleted

## 2023-01-29 VITALS — BP 138/93

## 2023-01-29 DIAGNOSIS — Z0130 Encounter for examination of blood pressure without abnormal findings: Secondary | ICD-10-CM

## 2023-01-29 NOTE — Progress Notes (Addendum)
   NURSE VISIT- BLOOD PRESSURE CHECK  I connected with Julia Sanders on 01/29/2023 by MyChart video and verified that I am speaking with the correct person using two identifiers.   I discussed the limitations of evaluation and management by telemedicine. The patient expressed understanding and agreed to proceed.  Nurse is at the office, and patient is at home.  SUBJECTIVE:  Julia Sanders is a 22 y.o. [phone] female here for BP check. She is postpartum, delivery date 12/15/22     HYPERTENSION ROS:  Pregnant/postpartum:  Severe headaches that don't go away with tylenol/other medicines: No  Visual changes (seeing spots/double/blurred vision) No  Severe pain under right breast breast or in center of upper chest No  Severe nausea/vomiting No  Taking medicines as instructed yes    OBJECTIVE:  BP (!) 138/93   LMP 12/19/2022   Appearance alert, well appearing, and in no distress.  ASSESSMENT: Postpartum  blood pressure check  PLAN: Discussed with Cathie Beams, CNM   Recommendations:  increase Nifedipine to 60 mg and have nurse mychart bp check on Monday     Follow-up:  Monday at 210 for nurse bp check mychart    Annamarie Dawley  01/29/2023 3:10 PM    Chart reviewed for nurse visit. Agree with plan of care.  Jacklyn Shell, [place] 01/29/2023 9:58 PM

## 2023-02-02 ENCOUNTER — Telehealth (INDEPENDENT_AMBULATORY_CARE_PROVIDER_SITE_OTHER): Payer: 59 | Admitting: *Deleted

## 2023-02-02 VITALS — BP 113/75

## 2023-02-02 DIAGNOSIS — Z0130 Encounter for examination of blood pressure without abnormal findings: Secondary | ICD-10-CM

## 2023-02-02 DIAGNOSIS — Z8759 Personal history of other complications of pregnancy, childbirth and the puerperium: Secondary | ICD-10-CM

## 2023-02-02 NOTE — Progress Notes (Signed)
   NURSE VISIT- BLOOD PRESSURE CHECK  SUBJECTIVE:  SOPHONIE ARING is a 22 y.o. [phone] female here for BP check. She is postpartum, delivery date 12/16/22     HYPERTENSION ROS:  Postpartum:  Severe headaches that don't go away with tylenol/other medicines: No  Visual changes (seeing spots/double/blurred vision) No  Severe pain under right breast breast or in center of upper chest No  Severe nausea/vomiting No  Taking medicines as instructed yes  States she woke up with a headache and dizziness.  BP lower than normal.   OBJECTIVE:  BP 113/75   LMP 12/19/2022   Appearance alert, well appearing, and in no distress.  ASSESSMENT: Postpartum  blood pressure check  PLAN: Discussed with Dr. Despina Hidden   Recommendations:  decrease medication back down to 30mg  daily    Follow-up: in 2 weeks   Konstantin Lehnen  02/02/2023 2:54 PM

## 2023-02-16 ENCOUNTER — Telehealth: Payer: 59

## 2023-02-16 ENCOUNTER — Encounter: Payer: Self-pay | Admitting: Advanced Practice Midwife

## 2023-02-17 ENCOUNTER — Other Ambulatory Visit: Payer: Self-pay | Admitting: *Deleted

## 2023-02-17 MED ORDER — NORETHIN-ETH ESTRAD-FE BIPHAS 1 MG-10 MCG / 10 MCG PO TABS: 1.0000 | ORAL_TABLET | Freq: Every day | ORAL | 11 refills | Status: DC

## 2023-07-22 ENCOUNTER — Ambulatory Visit (INDEPENDENT_AMBULATORY_CARE_PROVIDER_SITE_OTHER): Payer: 59

## 2023-07-22 VITALS — BP 133/81 | HR 88 | Ht 74.0 in | Wt 246.0 lb

## 2023-07-22 DIAGNOSIS — Z3201 Encounter for pregnancy test, result positive: Secondary | ICD-10-CM

## 2023-07-22 LAB — POCT URINE PREGNANCY: Preg Test, Ur: POSITIVE — AB

## 2023-07-22 NOTE — Progress Notes (Signed)
   NURSE VISIT- PREGNANCY CONFIRMATION   SUBJECTIVE:  Julia Sanders is a 22 y.o. [phone] female at Unknown by uncertain LMP of No LMP recorded (lmp unknown). Patient is pregnant. Here for pregnancy confirmation.  Home pregnancy test: positive x 1   She reports cramping.  She is not taking prenatal vitamins.    OBJECTIVE:  BP 133/81 (BP Location: Left Arm, Patient Position: Sitting, Cuff Size: Normal)   Pulse 88   Ht 6\' 2"  (1.88 m)   Wt 246 lb (111.6 kg)   LMP  (LMP Unknown)   Breastfeeding No   BMI 31.58 kg/m   Appears well, in no apparent distress  Results for orders placed or performed in visit on 07/22/23 (from the past 24 hours)  POCT urine pregnancy   Collection Time: 07/22/23  2:01 PM  Result Value Ref Range   Preg Test, Ur Positive (A) Negative    ASSESSMENT: Positive pregnancy test, Unknown by LMP    PLAN: Schedule for dating ultrasound pending quant results Prenatal vitamins: note routed to [facility] to send prescription   Nausea medicines: not currently needed   OB packet given: Yes  Malachy Mood  07/22/2023 2:05 PM

## 2023-07-23 ENCOUNTER — Other Ambulatory Visit: Payer: Self-pay | Admitting: Women's Health

## 2023-07-23 LAB — BETA HCG QUANT (REF LAB): hCG Quant: 2702 m[IU]/mL

## 2023-07-23 MED ORDER — PRENATAL PLUS 27-1 MG PO TABS: 1.0000 | ORAL_TABLET | Freq: Every day | ORAL | 3 refills | Status: AC

## 2023-07-30 ENCOUNTER — Encounter: Payer: Self-pay | Admitting: Women's Health

## 2023-07-30 MED ORDER — ONDANSETRON 8 MG PO TBDP: 8.0000 mg | ORAL_TABLET | Freq: Three times a day (TID) | ORAL | 1 refills | Status: DC | PRN

## 2023-08-05 NOTE — L&D Delivery Note (Signed)
 [facility]N [facility] Delivery Note  Julia Sanders is a 22 y.o. [phone] s/p NSVD at [redacted]w[redacted]d. She was admitted for IOL for gHTN.   ROM: 1h 50m with clear fluid GBS Status: positive, penicillin  x 2 doses prior to delivery Maximum Maternal Temperature: 98.82F  Labor Progress: Labor induced with Cytotec , Pitocin , and AROM.  Delivery Date/Time: 03/04/24 @ 1858 Delivery: Called to room because RN found patient to be complete and +1. Patient pushed with 4 contractions. Head delivered LOA, restituted to LOT. Single nuchal cord present. Shoulder and body delivered with somersault maneuver and cord reduced easily following delivery. Infant with spontaneous cry, placed on mother's abdomen, dried and stimulated. Cord clamped x 2 after 2-minute delay, and cut by FOB. Cord blood collected. Placenta delivered spontaneously, intact, with 3-vessel cord. Fundus firm with massage and Pitocin . Baby taken to warmer for eval by RN due to grunting; returned skin to skin prior to SNM leaving the room. Labia, perineum, vagina, and cervix inspected; 1st degree perineal laceration found, repaired with 3.0 Vicryl Rapide.  Placenta: intact, to L&D Complications: none Lacerations: 1st degree perineal laceration, repaired with 3.0 Vicryl Rapide EBL: 140mL Analgesia: epidural  Infant: female  APGARs 9,9  weight pending  Vernell Ruddle, SNM 03/04/2024, 7:30 PM

## 2023-08-06 ENCOUNTER — Other Ambulatory Visit: Payer: Self-pay | Admitting: Obstetrics & Gynecology

## 2023-08-06 DIAGNOSIS — O3680X0 Pregnancy with inconclusive fetal viability, not applicable or unspecified: Secondary | ICD-10-CM

## 2023-08-07 ENCOUNTER — Ambulatory Visit: Payer: 59

## 2023-08-07 DIAGNOSIS — Z3A01 Less than 8 weeks gestation of pregnancy: Secondary | ICD-10-CM | POA: Diagnosis not present

## 2023-08-07 DIAGNOSIS — O3680X0 Pregnancy with inconclusive fetal viability, not applicable or unspecified: Secondary | ICD-10-CM

## 2023-08-07 DIAGNOSIS — Z3491 Encounter for supervision of normal pregnancy, unspecified, first trimester: Secondary | ICD-10-CM | POA: Diagnosis not present

## 2023-08-07 NOTE — Progress Notes (Signed)
 [place] 7+1 wks,single IUP with yolk sac,CRL 10.47 mm,normal right ovary,left oophorectomy,FHR 152 bpm

## 2023-08-12 ENCOUNTER — Other Ambulatory Visit: Payer: Self-pay | Admitting: Adult Health

## 2023-08-12 MED ORDER — PROMETHAZINE HCL 25 MG PO TABS: 25.0000 mg | ORAL_TABLET | Freq: Four times a day (QID) | ORAL | 1 refills | Status: DC | PRN

## 2023-08-12 NOTE — Progress Notes (Signed)
Rx phenergan

## 2023-09-09 ENCOUNTER — Other Ambulatory Visit: Payer: Self-pay | Admitting: Obstetrics & Gynecology

## 2023-09-09 ENCOUNTER — Encounter: Payer: Self-pay | Admitting: Women's Health

## 2023-09-09 DIAGNOSIS — Z3490 Encounter for supervision of normal pregnancy, unspecified, unspecified trimester: Secondary | ICD-10-CM | POA: Insufficient documentation

## 2023-09-09 DIAGNOSIS — Z8759 Personal history of other complications of pregnancy, childbirth and the puerperium: Secondary | ICD-10-CM | POA: Insufficient documentation

## 2023-09-09 DIAGNOSIS — O0990 Supervision of high risk pregnancy, unspecified, unspecified trimester: Secondary | ICD-10-CM | POA: Insufficient documentation

## 2023-09-09 DIAGNOSIS — Z3682 Encounter for antenatal screening for nuchal translucency: Secondary | ICD-10-CM

## 2023-09-10 ENCOUNTER — Ambulatory Visit (INDEPENDENT_AMBULATORY_CARE_PROVIDER_SITE_OTHER): Payer: 59 | Admitting: Women's Health

## 2023-09-10 ENCOUNTER — Encounter: Payer: 59 | Admitting: *Deleted

## 2023-09-10 ENCOUNTER — Ambulatory Visit (INDEPENDENT_AMBULATORY_CARE_PROVIDER_SITE_OTHER): Payer: 59

## 2023-09-10 ENCOUNTER — Encounter: Payer: Self-pay | Admitting: Women's Health

## 2023-09-10 VITALS — BP 126/86 | HR 63 | Wt 229.0 lb

## 2023-09-10 DIAGNOSIS — Z3A12 12 weeks gestation of pregnancy: Secondary | ICD-10-CM

## 2023-09-10 DIAGNOSIS — Z3682 Encounter for antenatal screening for nuchal translucency: Secondary | ICD-10-CM | POA: Diagnosis not present

## 2023-09-10 DIAGNOSIS — O210 Mild hyperemesis gravidarum: Secondary | ICD-10-CM | POA: Diagnosis not present

## 2023-09-10 DIAGNOSIS — Z131 Encounter for screening for diabetes mellitus: Secondary | ICD-10-CM | POA: Diagnosis not present

## 2023-09-10 DIAGNOSIS — Z8759 Personal history of other complications of pregnancy, childbirth and the puerperium: Secondary | ICD-10-CM | POA: Diagnosis not present

## 2023-09-10 DIAGNOSIS — Z1332 Encounter for screening for maternal depression: Secondary | ICD-10-CM

## 2023-09-10 DIAGNOSIS — Z3480 Encounter for supervision of other normal pregnancy, unspecified trimester: Secondary | ICD-10-CM

## 2023-09-10 DIAGNOSIS — Z3481 Encounter for supervision of other normal pregnancy, first trimester: Secondary | ICD-10-CM

## 2023-09-10 MED ORDER — ASPIRIN 81 MG PO TBEC
162.0000 mg | DELAYED_RELEASE_TABLET | Freq: Every day | ORAL | 2 refills | Status: DC
Start: 2023-09-10 — End: 2024-03-06

## 2023-09-10 MED ORDER — DOXYLAMINE-PYRIDOXINE 10-10 MG PO TBEC: DELAYED_RELEASE_TABLET | ORAL | 6 refills | Status: DC

## 2023-09-10 MED ORDER — SCOPOLAMINE 1 MG/3DAYS TD PT72: 1.0000 | MEDICATED_PATCH | TRANSDERMAL | 12 refills | Status: DC

## 2023-09-10 MED ORDER — METOCLOPRAMIDE HCL 5 MG PO TABS: ORAL_TABLET | ORAL | 6 refills | Status: DC

## 2023-09-10 NOTE — Progress Notes (Signed)
 US  12 wks,measurements c/w dates,CRL 65.97 mm,NB present,NT 1.8 mm,normal right ovary,left oophorectomy,FHR 152 bpm

## 2023-09-10 NOTE — Progress Notes (Signed)
 INITIAL OBSTETRICAL VISIT Patient name: Julia Sanders MRN [id number]  Date of birth: Apr 11, 2001 Chief Complaint:   Initial Prenatal Visit (Nausea meds not helping, no appetite)  History of Present Illness:   Julia Sanders is a 23 y.o. [phone] Caucasian female at [redacted]w[redacted]d by US  at 7 weeks with an Estimated Date of Delivery: 03/24/24 being seen today for her initial obstetrical visit.   No LMP recorded (lmp unknown). Patient is pregnant. Her obstetrical history is significant for  SAB x 1, 36.5wk IOL d/t severe pre-e .   Today she reports  n/v getting worse, zofran /phenergan  not helping. Last vomited this am. Hasn't peed today. .  Last pap 06/30/22. Results were: NILM w/ HRHPV not done     09/10/2023    2:34 PM  Depression screen PHQ 2/9  Decreased Interest 1  Down, Depressed, Hopeless 1  PHQ - 2 Score 2  Altered sleeping 1  Tired, decreased energy 1  Change in appetite 1  Feeling bad or failure about yourself  0  Trouble concentrating 0  Moving slowly or fidgety/restless 0  Suicidal thoughts 0  PHQ-9 Score 5        09/10/2023    2:34 PM  GAD 7 : Generalized Anxiety Score  Nervous, Anxious, on Edge 0  Control/stop worrying 0  Worry too much - different things 0  Trouble relaxing 1  Restless 0  Easily annoyed or irritable 1  Afraid - awful might happen 0  Total GAD 7 Score 2     Review of Systems:   Pertinent items are noted in HPI Denies cramping/contractions, leakage of fluid, vaginal bleeding, abnormal vaginal discharge w/ itching/odor/irritation, headaches, visual changes, shortness of breath, chest pain, abdominal pain, severe nausea/vomiting, or problems with urination or bowel movements unless otherwise stated above.  Pertinent History Reviewed:  Reviewed past medical,surgical, social, obstetrical and family history.  Reviewed problem list, medications and allergies. OB History  Gravida Para Term Preterm AB Living  3 1  1 1 1   SAB IAB Ectopic Multiple  Live Births  1   0 1    # Outcome Date GA Lbr Len/2nd Weight Sex Type Anes PTL Lv  3 Current           2 Preterm 12/16/22 [redacted]w[redacted]d 02:20 / 01:12 7 lb 0.5 oz (3.19 kg) M Vag-Spont EPI  LIV     Complications: Severe pre-eclampsia  1 SAB            Physical Assessment:   Vitals:   09/10/23 1429  BP: 126/86  Pulse: 63  Weight: 229 lb (103.9 kg)  Body mass index is 29.4 kg/m.       Physical Examination:  General appearance - well appearing, and in no distress  Mental status - alert, oriented to person, place, and time  Psych:  She has a normal mood and affect  Skin - warm and dry, normal color, no suspicious lesions noted  Chest - effort normal, all lung fields clear to auscultation bilaterally  Heart - normal rate and regular rhythm  Abdomen - soft, nontender  Extremities:  No swelling or varicosities noted  Pelvic - VULVA: normal appearing vulva with no masses, tenderness or lesions  VAGINA: normal appearing vagina with normal color and discharge, no lesions  CERVIX: normal appearing cervix without discharge or lesions, no CMT  Thin prep pap is not done   Chaperone: N/A    TODAY'S NT US  12 wks,measurements c/w dates,CRL 65.97 mm,NB present,NT  1.8 mm,normal right ovary,left oophorectomy,FHR 152 bpm   No results found for this or any previous visit (from the past 24 hours).  Assessment & Plan:  1) Low-Risk Pregnancy [id number] at [redacted]w[redacted]d with an Estimated Date of Delivery: 03/24/24   2) Initial OB visit  3) HG> 17lb wt loss since 12/18, has phenergan  and zofran , voided small amt tea colored urine for sample today, has slightly moist mucous membranes, hasn't vomited since this am. Did drink some Dr. Nunzio earlier that stayed down. Discussed w/ Ozan, rx reglan , scop patch, get CMP. Try to slowly increase po fluids/food. Pt to go to [facility] if needed. F/U 1wk  4) H/O severe pre-e w/ IOL 36.5w> baseline labs, ASA 162mg   Meds:  Meds ordered this encounter  Medications    Doxylamine -Pyridoxine  (DICLEGIS ) 10-10 MG TBEC    Sig: 2 tabs q hs, if sx persist add 1 tab q am on day 3, if sx persist add 1 tab q afternoon on day 4    Dispense:  100 tablet    Refill:  6   aspirin  EC 81 MG tablet    Sig: Take 2 tablets (162 mg total) by mouth daily. Swallow whole.    Dispense:  180 tablet    Refill:  2   metoCLOPramide  (REGLAN ) 5 MG tablet    Sig: 1-2 tablets q 8 hours as needed for nausea and vomiting    Dispense:  120 tablet    Refill:  6   scopolamine  (TRANSDERM SCOP , 1.5 MG,) 1 MG/3DAYS    Sig: Place 1 patch (1.5 mg total) onto the skin every 3 (three) days.    Dispense:  10 patch    Refill:  12    Initial labs obtained Continue prenatal vitamins Reviewed n/v relief measures and warning s/s to report Reviewed recommended weight gain based on pre-gravid BMI Encouraged well-balanced diet Genetic & carrier screening discussed: requests Panorama and NT/IT, declined [facility] Ultrasound discussed; fetal survey: requested CCNC completed> form faxed if has or is planning to apply for medicaid The nature of [place] [facility] [employer] with multiple MDs and other Advanced Practice Providers was explained to patient; also emphasized that fellows, residents, and students are part of our team. Does have home bp cuff. Office bp cuff given: no. Rx sent: n/a. Check bp weekly, let us  know if consistently >140/90.   Follow-up: Return in about 1 week (around 09/17/2023) for LROB, MD only, in person.   Orders Placed This Encounter  Procedures   Urine Culture   GC/Chlamydia Probe Amp   Integrated 1   Hemoglobin A1c   CBC/D/Plt+RPR+Rh+ABO+RubIgG...   PANORAMA PRENATAL TEST   Protein / creatinine ratio, urine   Comprehensive metabolic panel    Suzen JONELLE Fetters CNM, [facility] 09/10/2023 3:43 PM

## 2023-09-10 NOTE — Patient Instructions (Signed)
Julia Sanders, thank you for choosing our office today! We appreciate the opportunity to meet your healthcare needs. You may receive a short survey by mail, e-mail, or through [employer]. If you are happy with your care we would appreciate if you could take just a few minutes to complete the survey questions. We read all of your comments and take your feedback very seriously. Thank you again for choosing our office.  Center for [employer] [facility] Team at [facility]  [facility] at [facility] ([address] [place], [place] [address]) Entrance C, located off of [address] Free 24/7 valet parking   Nausea & Vomiting Have saltine crackers or pretzels by your bed and eat a few bites before you raise your head out of bed in the morning Eat small frequent meals throughout the day instead of large meals Drink plenty of fluids throughout the day to stay hydrated, just don't drink a lot of fluids with your meals.  This can make your stomach fill up faster making you feel sick Do not brush your teeth right after you eat Products with real ginger are good for nausea, like ginger ale and ginger hard candy Make sure it says made with real ginger! Sucking on sour candy like lemon heads is also good for nausea If your prenatal vitamins make you nauseated, take them at night so you will sleep through the nausea Sea Bands If you feel like you need medicine for the nausea & vomiting please let us know If you are unable to keep any fluids or food down please let us know   Constipation Drink plenty of fluid, preferably water, throughout the day Eat foods high in fiber such as fruits, vegetables, and grains Exercise, such as walking, is a good way to keep your bowels regular Drink warm fluids, especially warm prune juice, or decaf coffee Eat a 1/2 cup of real oatmeal (not instant), 1/2 cup applesauce, and 1/2-1 cup warm prune juice every day If needed, you may take Colace (docusate sodium) stool  softener once or twice a day to help keep the stool soft.  If you still are having problems with constipation, you may take Miralax once daily as needed to help keep your bowels regular.   Home Blood Pressure Monitoring for Patients   Your provider has recommended that you check your blood pressure (BP) at least once a week at home. If you do not have a blood pressure cuff at home, one will be provided for you. Contact your provider if you have not received your monitor within 1 week.   Helpful Tips for Accurate Home Blood Pressure Checks  Don't smoke, exercise, or drink caffeine 30 minutes before checking your BP Use the restroom before checking your BP (a full bladder can raise your pressure) Relax in a comfortable upright chair Feet on the ground Left arm resting comfortably on a flat surface at the level of your heart Legs uncrossed Back supported Sit quietly and don't talk Place the cuff on your bare arm Adjust snuggly, so that only two fingertips can fit between your skin and the top of the cuff Check 2 readings separated by at least one minute Keep a log of your BP readings For a visual, please reference this diagram: http://ccnc.care/bpdiagram  Provider Name: [facility]     Phone: [phone]  Zone 1: ALL CLEAR  Continue to monitor your symptoms:  BP reading is less than 140 (top number) or less than 90 (bottom  number)  No right upper stomach pain No headaches or seeing spots No feeling nauseated or throwing up No swelling in face and hands  Zone 2: CAUTION Call your doctor's office for any of the following:  BP reading is greater than 140 (top number) or greater than 90 (bottom number)  Stomach pain under your ribs in the middle or right side Headaches or seeing spots Feeling nauseated or throwing up Swelling in face and hands  Zone 3: EMERGENCY  Seek immediate medical care if you have any of the following:  BP reading is greater than160 (top number) or  greater than 110 (bottom number) Severe headaches not improving with Tylenol Serious difficulty catching your breath Any worsening symptoms from Zone 2    First Trimester of Pregnancy The first trimester of pregnancy is from week 1 until the end of week 12 (months 1 through 3). A week after a sperm fertilizes an egg, the egg will implant on the wall of the uterus. This embryo will begin to develop into a baby. Genes from you and your partner are forming the baby. The female genes determine whether the baby is a boy or a girl. At 6-8 weeks, the eyes and face are formed, and the heartbeat can be seen on ultrasound. At the end of 12 weeks, all the baby's organs are formed.  Now that you are pregnant, you will want to do everything you can to have a healthy baby. Two of the most important things are to get good prenatal care and to follow your health care provider's instructions. Prenatal care is all the medical care you receive before the baby's birth. This care will help prevent, find, and treat any problems during the pregnancy and childbirth. BODY CHANGES Your body goes through many changes during pregnancy. The changes vary from woman to woman.  You may gain or lose a couple of pounds at first. You may feel sick to your stomach (nauseous) and throw up (vomit). If the vomiting is uncontrollable, call your health care provider. You may tire easily. You may develop headaches that can be relieved by medicines approved by your health care provider. You may urinate more often. Painful urination may mean you have a bladder infection. You may develop heartburn as a result of your pregnancy. You may develop constipation because certain hormones are causing the muscles that push waste through your intestines to slow down. You may develop hemorrhoids or swollen, bulging veins (varicose veins). Your breasts may begin to grow larger and become tender. Your nipples may stick out more, and the tissue that  surrounds them (areola) may become darker. Your gums may bleed and may be sensitive to brushing and flossing. Dark spots or blotches (chloasma, mask of pregnancy) may develop on your face. This will likely fade after the baby is born. Your menstrual periods will stop. You may have a loss of appetite. You may develop cravings for certain kinds of food. You may have changes in your emotions from day to day, such as being excited to be pregnant or being concerned that something may go wrong with the pregnancy and baby. You may have more vivid and strange dreams. You may have changes in your hair. These can include thickening of your hair, rapid growth, and changes in texture. Some women also have hair loss during or after pregnancy, or hair that feels dry or thin. Your hair will most likely return to normal after your baby is born. WHAT TO EXPECT AT YOUR PRENATAL  VISITS During a routine prenatal visit: You will be weighed to make sure you and the baby are growing normally. Your blood pressure will be taken. Your abdomen will be measured to track your baby's growth. The fetal heartbeat will be listened to starting around week 10 or 12 of your pregnancy. Test results from any previous visits will be discussed. Your health care provider may ask you: How you are feeling. If you are feeling the baby move. If you have had any abnormal symptoms, such as leaking fluid, bleeding, severe headaches, or abdominal cramping. If you have any questions. Other tests that may be performed during your first trimester include: Blood tests to find your blood type and to check for the presence of any previous infections. They will also be used to check for low iron levels (anemia) and Rh antibodies. Later in the pregnancy, blood tests for diabetes will be done along with other tests if problems develop. Urine tests to check for infections, diabetes, or protein in the urine. An ultrasound to confirm the proper growth  and development of the baby. An amniocentesis to check for possible genetic problems. Fetal screens for spina bifida and Down syndrome. You may need other tests to make sure you and the baby are doing well. HOME CARE INSTRUCTIONS  Medicines Follow your health care provider's instructions regarding medicine use. Specific medicines may be either safe or unsafe to take during pregnancy. Take your prenatal vitamins as directed. If you develop constipation, try taking a stool softener if your health care provider approves. Diet Eat regular, well-balanced meals. Choose a variety of foods, such as meat or vegetable-based protein, fish, milk and low-fat dairy products, vegetables, fruits, and whole grain breads and cereals. Your health care provider will help you determine the amount of weight gain that is right for you. Avoid raw meat and uncooked cheese. These carry germs that can cause birth defects in the baby. Eating four or five small meals rather than three large meals a day may help relieve nausea and vomiting. If you start to feel nauseous, eating a few soda crackers can be helpful. Drinking liquids between meals instead of during meals also seems to help nausea and vomiting. If you develop constipation, eat more high-fiber foods, such as fresh vegetables or fruit and whole grains. Drink enough fluids to keep your urine clear or pale yellow. Activity and Exercise Exercise only as directed by your health care provider. Exercising will help you: Control your weight. Stay in shape. Be prepared for labor and delivery. Experiencing pain or cramping in the lower abdomen or low back is a good sign that you should stop exercising. Check with your health care provider before continuing normal exercises. Try to avoid standing for long periods of time. Move your legs often if you must stand in one place for a long time. Avoid heavy lifting. Wear low-heeled shoes, and practice good posture. You may  continue to have sex unless your health care provider directs you otherwise. Relief of Pain or Discomfort Wear a good support bra for breast tenderness.   Take warm sitz baths to soothe any pain or discomfort caused by hemorrhoids. Use hemorrhoid cream if your health care provider approves.   Rest with your legs elevated if you have leg cramps or low back pain. If you develop varicose veins in your legs, wear support hose. Elevate your feet for 15 minutes, 3-4 times a day. Limit salt in your diet. Prenatal Care Schedule your prenatal visits by the  twelfth week of pregnancy. They are usually scheduled monthly at first, then more often in the last 2 months before delivery. Write down your questions. Take them to your prenatal visits. Keep all your prenatal visits as directed by your health care provider. Safety Wear your seat belt at all times when driving. Make a list of emergency phone numbers, including numbers for family, friends, the hospital, and police and fire departments. General Tips Ask your health care provider for a referral to a local prenatal education class. Begin classes no later than at the beginning of month 6 of your pregnancy. Ask for help if you have counseling or nutritional needs during pregnancy. Your health care provider can offer advice or refer you to specialists for help with various needs. Do not use hot tubs, steam rooms, or saunas. Do not douche or use tampons or scented sanitary pads. Do not cross your legs for long periods of time. Avoid cat litter boxes and soil used by cats. These carry germs that can cause birth defects in the baby and possibly loss of the fetus by miscarriage or stillbirth. Avoid all smoking, herbs, alcohol, and medicines not prescribed by your health care provider. Chemicals in these affect the formation and growth of the baby. Schedule a dentist appointment. At home, brush your teeth with a soft toothbrush and be gentle when you floss. SEEK  MEDICAL CARE IF:  You have dizziness. You have mild pelvic cramps, pelvic pressure, or nagging pain in the abdominal area. You have persistent nausea, vomiting, or diarrhea. You have a bad smelling vaginal discharge. You have pain with urination. You notice increased swelling in your face, hands, legs, or ankles. SEEK IMMEDIATE MEDICAL CARE IF:  You have a fever. You are leaking fluid from your vagina. You have spotting or bleeding from your vagina. You have severe abdominal cramping or pain. You have rapid weight gain or loss. You vomit blood or material that looks like coffee grounds. You are exposed to [place] measles and have never had them. You are exposed to fifth disease or chickenpox. You develop a severe headache. You have shortness of breath. You have any kind of trauma, such as from a fall or a car accident. Document Released: 07/15/2001 Document Revised: 12/05/2013 Document Reviewed: 05/31/2013 [facility] Patient Information 2015 [place], [place]. This information is not intended to replace advice given to you by your health care provider. Make sure you discuss any questions you have with your health care provider.

## 2023-09-11 LAB — INTEGRATED 1

## 2023-09-12 LAB — PROTEIN / CREATININE RATIO, URINE
Creatinine, Urine: 596.9 mg/dL
Protein, Ur: 40 mg/dL
Protein/Creat Ratio: 67 mg/g{creat} (ref 0–200)

## 2023-09-12 LAB — CBC/D/PLT+RPR+RH+ABO+RUBIGG...
Antibody Screen: NEGATIVE
Basophils Absolute: 0.1 10*3/uL (ref 0.0–0.2)
Basos: 1 %
EOS (ABSOLUTE): 0.1 10*3/uL (ref 0.0–0.4)
Eos: 1 %
HIV Screen 4th Generation wRfx: NONREACTIVE
Hematocrit: 38.9 % (ref 34.0–46.6)
Hemoglobin: 13 g/dL (ref 11.1–15.9)
Immature Grans (Abs): 0 10*3/uL (ref 0.0–0.1)
Immature Granulocytes: 0 %
Lymphocytes Absolute: 2 10*3/uL (ref 0.7–3.1)
Lymphs: 20 %
MCH: 29 pg (ref 26.6–33.0)
MCHC: 33.4 g/dL (ref 31.5–35.7)
MCV: 87 fL (ref 79–97)
Monocytes Absolute: 0.7 10*3/uL (ref 0.1–0.9)
Monocytes: 7 %
Neutrophils Absolute: 7.2 10*3/uL — ABNORMAL HIGH (ref 1.4–7.0)
Neutrophils: 71 %
Platelets: 362 10*3/uL (ref 150–450)
RBC: 4.48 x10E6/uL (ref 3.77–5.28)
RDW: 13.2 % (ref 11.7–15.4)
RPR Ser Ql: NONREACTIVE
Rh Factor: POSITIVE
Rubella Antibodies, IGG: 1.77 {index} (ref >0.99)
WBC: 10.1 10*3/uL (ref 3.4–10.8)

## 2023-09-12 LAB — COMPREHENSIVE METABOLIC PANEL
ALT: 4 IU/L (ref 0–32)
AST: 10 IU/L (ref 0–40)
Albumin: 4.3 g/dL (ref 4.0–5.0)
Alkaline Phosphatase: 71 IU/L (ref 44–121)
BUN/Creatinine Ratio: 9 (ref 9–23)
BUN: 6 mg/dL (ref 6–20)
Bilirubin Total: 0.2 mg/dL (ref 0.0–1.2)
CO2: 18 mmol/L — ABNORMAL LOW (ref 20–29)
Calcium: 9.6 mg/dL (ref 8.7–10.2)
Chloride: 99 mmol/L (ref 96–106)
Creatinine, Ser: 0.65 mg/dL (ref 0.57–1.00)
Globulin, Total: 3.1 g/dL (ref 1.5–4.5)
Glucose: 86 mg/dL (ref 70–99)
Potassium: 4 mmol/L (ref 3.5–5.2)
Sodium: 136 mmol/L (ref 134–144)
Total Protein: 7.4 g/dL (ref 6.0–8.5)
eGFR: 125 mL/min/{1.73_m2} (ref >59)

## 2023-09-12 LAB — HEMOGLOBIN A1C
Est. average glucose Bld gHb Est-mCnc: 111 mg/dL
Hgb A1c MFr Bld: 5.5 % (ref 4.8–5.6)

## 2023-09-12 LAB — INTEGRATED 1
Crown Rump Length: 65.9 mm
Gest. Age on Collection Date: 12.7 wk
PAPP-A Value: 329 ng/mL
Race: 1
Sonographer ID#: 25.5 a
Sonographer ID#: 309760
Weight: 1.8 mm
Weight: 229 [lb_av]

## 2023-09-12 LAB — HCV INTERPRETATION

## 2023-09-12 LAB — URINE CULTURE

## 2023-09-14 ENCOUNTER — Encounter: Payer: Self-pay | Admitting: Women's Health

## 2023-09-15 LAB — GC/CHLAMYDIA PROBE AMP
Chlamydia trachomatis, NAA: NEGATIVE
Neisseria Gonorrhoeae by PCR: NEGATIVE

## 2023-09-17 ENCOUNTER — Encounter: Payer: 59 | Admitting: Obstetrics & Gynecology

## 2023-09-17 LAB — PANORAMA PRENATAL TEST FULL PANEL:PANORAMA TEST PLUS 5 ADDITIONAL MICRODELETIONS: FETAL FRACTION: 8.7

## 2023-09-21 ENCOUNTER — Encounter: Payer: Self-pay | Admitting: Women's Health

## 2023-09-22 ENCOUNTER — Ambulatory Visit: Payer: 59 | Admitting: Obstetrics & Gynecology

## 2023-09-22 ENCOUNTER — Encounter: Payer: Self-pay | Admitting: Obstetrics & Gynecology

## 2023-09-22 VITALS — BP 118/78 | HR 82 | Wt 236.0 lb

## 2023-09-22 DIAGNOSIS — Z3A13 13 weeks gestation of pregnancy: Secondary | ICD-10-CM | POA: Diagnosis not present

## 2023-09-22 DIAGNOSIS — Z8759 Personal history of other complications of pregnancy, childbirth and the puerperium: Secondary | ICD-10-CM | POA: Diagnosis not present

## 2023-09-22 DIAGNOSIS — Z3480 Encounter for supervision of other normal pregnancy, unspecified trimester: Secondary | ICD-10-CM | POA: Diagnosis not present

## 2023-09-22 NOTE — Progress Notes (Signed)
   LOW-RISK PREGNANCY VISIT Patient name: Julia Sanders MRN [id number]  Date of birth: 2001/03/27 Chief Complaint:   Routine Prenatal Visit  History of Present Illness:   Julia Sanders is a 23 y.o. [phone] female at [redacted]w[redacted]d with an Estimated Date of Delivery: 03/24/24 being seen today for ongoing management of a low-risk pregnancy.     09/10/2023    2:34 PM  Depression screen PHQ 2/9  Decreased Interest 1  Down, Depressed, Hopeless 1  PHQ - 2 Score 2  Altered sleeping 1  Tired, decreased energy 1  Change in appetite 1  Feeling bad or failure about yourself  0  Trouble concentrating 0  Moving slowly or fidgety/restless 0  Suicidal thoughts 0  PHQ-9 Score 5    Today she reports improven=ment in her N/V. Contractions: Not present.  .  Movement: Absent. denies leaking of fluid. Review of Systems:   Pertinent items are noted in HPI Denies abnormal vaginal discharge w/ itching/odor/irritation, headaches, visual changes, shortness of breath, chest pain, abdominal pain, severe nausea/vomiting, or problems with urination or bowel movements unless otherwise stated above. Pertinent History Reviewed:  Reviewed past medical,surgical, social, obstetrical and family history.  Reviewed problem list, medications and allergies. Physical Assessment:   Vitals:   09/22/23 1417  BP: 118/78  Pulse: 82  Weight: 236 lb (107 kg)  Body mass index is 30.3 kg/m.        Physical Examination:   General appearance: Well appearing, and in no distress  Mental status: Alert, oriented to person, place, and time  Skin: Warm & dry  Cardiovascular: Normal heart rate noted  Respiratory: Normal respiratory effort, no distress  Abdomen: Soft, gravid, nontender  Pelvic: Cervical exam deferred         Extremities:    Fetal Status:     Movement: Absent    Chaperone: n/a    No results found for this or any previous visit (from the past 24 hours).  Assessment & Plan:  1) Low-risk pregnancy [id number] at  [redacted]w[redacted]d with an Estimated Date of Delivery: 03/24/24   2) Hx of severe pre eclampsia   Meds: No orders of the defined types were placed in this encounter.  Labs/procedures today:   Plan:  Continue routine obstetrical care  Next visit: prefers in person      Follow-up: Return in about 4 weeks (around 10/20/2023) for LROB.  No orders of the defined types were placed in this encounter.   Lazaro Arms, MD 09/22/2023 3:00 PM

## 2023-10-22 ENCOUNTER — Ambulatory Visit: Payer: 59 | Admitting: Obstetrics and Gynecology

## 2023-10-22 ENCOUNTER — Encounter: Payer: Self-pay | Admitting: Obstetrics and Gynecology

## 2023-10-22 VITALS — BP 129/82 | HR 84 | Wt 229.0 lb

## 2023-10-22 DIAGNOSIS — Z3A18 18 weeks gestation of pregnancy: Secondary | ICD-10-CM

## 2023-10-22 DIAGNOSIS — Z8759 Personal history of other complications of pregnancy, childbirth and the puerperium: Secondary | ICD-10-CM

## 2023-10-22 DIAGNOSIS — Z1379 Encounter for other screening for genetic and chromosomal anomalies: Secondary | ICD-10-CM

## 2023-10-22 DIAGNOSIS — Z3480 Encounter for supervision of other normal pregnancy, unspecified trimester: Secondary | ICD-10-CM

## 2023-10-22 DIAGNOSIS — Z3482 Encounter for supervision of other normal pregnancy, second trimester: Secondary | ICD-10-CM | POA: Diagnosis not present

## 2023-10-22 NOTE — Progress Notes (Signed)
   PRENATAL VISIT NOTE  Subjective:  MANISHA CANCEL is a 23 y.o. [phone] at [redacted]w[redacted]d being seen today for ongoing prenatal care.  She is currently monitored for the following issues for this low-risk pregnancy and has History of severe pre-eclampsia; Encounter for supervision of normal pregnancy, antepartum; and Hyperemesis gravidarum on their problem list.  Patient reports  n/v doing a little better,  .  Contractions: Not present.  .  Movement: Present. Denies leaking of fluid.   The following portions of the patient's history were reviewed and updated as appropriate: allergies, current medications, past family history, past medical history, past social history, past surgical history and problem list.   Objective:   Vitals:   10/22/23 1402  BP: 129/82  Pulse: 84  Weight: 229 lb (103.9 kg)    Fetal Status: Fetal Heart Rate (bpm): 141   Movement: Present     General:  Alert, oriented and cooperative. Patient is in no acute distress.  Skin: Skin is warm and dry. No rash noted.   Cardiovascular: Normal heart rate noted  Respiratory: Normal respiratory effort, no problems with respiration noted  Abdomen: Soft, gravid, appropriate for gestational age.  Pain/Pressure: Absent     Pelvic: Cervical exam deferred        Extremities: Normal range of motion.  Edema: None  Mental Status: Normal mood and affect. Normal behavior. Normal judgment and thought content.   Assessment and Plan:  Pregnancy: [id number] at [redacted]w[redacted]d 1. Supervision of other normal pregnancy, antepartum (Primary)   2. [redacted] weeks gestation of pregnancy   3. Genetic testing  - [facility] 2  4. History of severe pre-eclampsia Normotensive, continue ASA Discussed exedrine tension for headache    Preterm labor symptoms and general obstetric precautions including but not limited to vaginal bleeding, contractions, leaking of fluid and fetal movement were reviewed in detail with the patient. Please refer to After Visit Summary  for other counseling recommendations.   Return in about 4 weeks (around 11/19/2023) for OB VISIT (MD or APP).    Albertine Grates, FNP

## 2023-10-24 LAB — INTEGRATED 2
AFP MoM: 1.07
Alpha-Fetoprotein: 34.3 ng/mL
Crown Rump Length: 65.9 mm
DIA MoM: 0.52
DIA Value: 68 pg/mL
Estriol, Unconjugated: 1.56 ng/mL
Gest. Age on Collection Date: 12.7 wk
Gestational Age: 18.7 wk
Maternal Age at EDD: 25.5 a
Nuchal Translucency (NT): 1.8 mm
Nuchal Translucency MoM: 0.98
Number of Fetuses: 1
PAPP-A MoM: 0.52
PAPP-A Value: 329 ng/mL
Sonographer ID#: 309760
Test Results:: NEGATIVE
Weight: 228 [lb_av]
Weight: 229 [lb_av]
hCG MoM: 0.63
hCG Value: 11.7 [IU]/mL
uE3 MoM: 0.96

## 2023-11-19 ENCOUNTER — Encounter: Admitting: Advanced Practice Midwife

## 2023-11-23 ENCOUNTER — Other Ambulatory Visit: Payer: Self-pay | Admitting: Obstetrics & Gynecology

## 2023-11-23 DIAGNOSIS — Z363 Encounter for antenatal screening for malformations: Secondary | ICD-10-CM

## 2023-11-24 ENCOUNTER — Ambulatory Visit

## 2023-11-24 ENCOUNTER — Ambulatory Visit: Admitting: Obstetrics and Gynecology

## 2023-11-24 ENCOUNTER — Encounter: Payer: Self-pay | Admitting: Obstetrics and Gynecology

## 2023-11-24 VITALS — BP 112/75 | HR 76 | Wt 237.0 lb

## 2023-11-24 DIAGNOSIS — Z3482 Encounter for supervision of other normal pregnancy, second trimester: Secondary | ICD-10-CM

## 2023-11-24 DIAGNOSIS — Z3A22 22 weeks gestation of pregnancy: Secondary | ICD-10-CM

## 2023-11-24 DIAGNOSIS — Z363 Encounter for antenatal screening for malformations: Secondary | ICD-10-CM

## 2023-11-24 DIAGNOSIS — Z3480 Encounter for supervision of other normal pregnancy, unspecified trimester: Secondary | ICD-10-CM

## 2023-11-24 DIAGNOSIS — Z8759 Personal history of other complications of pregnancy, childbirth and the puerperium: Secondary | ICD-10-CM | POA: Diagnosis not present

## 2023-11-24 NOTE — Progress Notes (Signed)
   PRENATAL VISIT NOTE  Subjective:  Julia Sanders is a 23 y.o. [phone] at [redacted]w[redacted]d being seen today for ongoing prenatal care.  She is currently monitored for the following issues for this low-risk pregnancy and has History of severe pre-eclampsia; Encounter for supervision of normal pregnancy, antepartum; and Hyperemesis gravidarum on their problem list.  Patient reports  nausea has gotten better .  Contractions: Not present.  .  Movement: Present. Denies leaking of fluid.   The following portions of the patient's history were reviewed and updated as appropriate: allergies, current medications, past family history, past medical history, past social history, past surgical history and problem list.   Objective:   Vitals:   11/24/23 1155  BP: 112/75  Pulse: 76  Weight: 237 lb (107.5 kg)    Fetal Status:   Fundal Height: 23 cm Movement: Present     General:  Alert, oriented and cooperative. Patient is in no acute distress.  Skin: Skin is warm and dry. No rash noted.   Cardiovascular: Normal heart rate noted  Respiratory: Normal respiratory effort, no problems with respiration noted  Abdomen: Soft, gravid, appropriate for gestational age.  Pain/Pressure: Absent     Pelvic: Cervical exam deferred        Extremities: Normal range of motion.  Edema: None  Mental Status: Normal mood and affect. Normal behavior. Normal judgment and thought content.   Assessment and Plan:  Pregnancy: [id number] at [redacted]w[redacted]d 1. Supervision of other normal pregnancy, antepartum (Primary) BP and FHR normal Doing well, feeling regular movement    2. [redacted] weeks gestation of pregnancy US  22+5 wks,transverse head left,cx 6.7 cm,anterior placenta gr 0,normal right ovary,left oophorectomy,FHR 146 bpm,SVP of fluid 5 cm,EFW 557 g 58%,limited view of spine because of fetal position,please have pt come back for a F/U US .  -Schedule u/s follow up  Anticipatory guidance regarding GTT and labs next visit, discussed NPO  status after midnight    3. History of severe pre-eclampsia Has been taking, discussed refills at pharmacy Normotensive today  Preterm labor symptoms and general obstetric precautions including but not limited to vaginal bleeding, contractions, leaking of fluid and fetal movement were reviewed in detail with the patient. Please refer to After Visit Summary for other counseling recommendations.   Return in about 4 weeks (around 12/22/2023), or LOB, PN2, follow up u/s 4 weeks.  Future Appointments  Date Time Provider Department Center  12/25/2023  8:30 AM [facility]  12/25/2023  9:10 AM Zelma Hidden, FNP [facility]     Susi Eric, FNP

## 2023-11-24 NOTE — Progress Notes (Signed)
 US  22+5 wks,transverse head left,cx 6.7 cm,anterior placenta gr 0,normal right ovary,left oophorectomy,FHR 146 bpm,SVP of fluid 5 cm,EFW 557 g 58%,limited view of spine because of fetal position,please have pt come back for a F/U US .

## 2023-11-27 ENCOUNTER — Encounter: Admitting: Obstetrics and Gynecology

## 2023-12-25 ENCOUNTER — Ambulatory Visit

## 2023-12-25 ENCOUNTER — Other Ambulatory Visit (HOSPITAL_COMMUNITY)
Admission: RE | Admit: 2023-12-25 | Discharge: 2023-12-25 | Disposition: A | Discharge status: Home / Self Care | Source: Ambulatory Visit | Attending: Obstetrics and Gynecology | Admitting: Obstetrics and Gynecology

## 2023-12-25 ENCOUNTER — Encounter: Payer: Self-pay | Admitting: Obstetrics and Gynecology

## 2023-12-25 ENCOUNTER — Ambulatory Visit: Admitting: Obstetrics and Gynecology

## 2023-12-25 ENCOUNTER — Other Ambulatory Visit

## 2023-12-25 VITALS — BP 115/79 | HR 87 | Wt 240.0 lb

## 2023-12-25 DIAGNOSIS — Z3A27 27 weeks gestation of pregnancy: Secondary | ICD-10-CM | POA: Diagnosis not present

## 2023-12-25 DIAGNOSIS — Z3480 Encounter for supervision of other normal pregnancy, unspecified trimester: Secondary | ICD-10-CM

## 2023-12-25 DIAGNOSIS — Z1332 Encounter for screening for maternal depression: Secondary | ICD-10-CM | POA: Diagnosis not present

## 2023-12-25 DIAGNOSIS — N898 Other specified noninflammatory disorders of vagina: Secondary | ICD-10-CM | POA: Insufficient documentation

## 2023-12-25 DIAGNOSIS — Z131 Encounter for screening for diabetes mellitus: Secondary | ICD-10-CM

## 2023-12-25 DIAGNOSIS — Z8759 Personal history of other complications of pregnancy, childbirth and the puerperium: Secondary | ICD-10-CM

## 2023-12-25 DIAGNOSIS — Z3482 Encounter for supervision of other normal pregnancy, second trimester: Secondary | ICD-10-CM

## 2023-12-25 DIAGNOSIS — O26892 Other specified pregnancy related conditions, second trimester: Secondary | ICD-10-CM | POA: Diagnosis not present

## 2023-12-25 NOTE — Progress Notes (Signed)
   PRENATAL VISIT NOTE  Subjective:  Julia Sanders is a 23 y.o. [phone] at [redacted]w[redacted]d being seen today for ongoing prenatal care.  She is currently monitored for the following issues for this low-risk pregnancy and has History of severe pre-eclampsia; Encounter for supervision of normal pregnancy, antepartum; and Hyperemesis gravidarum on their problem list.  Patient reports vaginal discharge and fluid, will leave a large spot on underwear. Denies odor, irritation, itching, dysuria. .  Contractions: Irritability. Vag. Bleeding: None.  Movement: Present. Denies leaking of fluid.   The following portions of the patient's history were reviewed and updated as appropriate: allergies, current medications, past family history, past medical history, past social history, past surgical history and problem list.   Objective:    Vitals:   12/25/23 1041  BP: 115/79  Pulse: 87  Weight: 240 lb (108.9 kg)    Fetal Status:    Fundal Height: 27 cm Movement: Present    General: Alert, oriented and cooperative. Patient is in no acute distress.  Skin: Skin is warm and dry. No rash noted.   Cardiovascular: Normal heart rate noted  Respiratory: Normal respiratory effort, no problems with respiration noted  Abdomen: Soft, gravid, appropriate for gestational age.  Pain/Pressure: Present     Pelvic: Thick creamy discharge present, swab collected with chaperone present      Extremities: Normal range of motion.  Edema: None  Mental Status: Normal mood and affect. Normal behavior. Normal judgment and thought content.  US  27+1 wks,breech,anterior placenta gr 0,normal right ovary,left oophorectomy,FHR 137 bpm,CX 5.2 cm,AFI 22 cm,EFW 1123 g 63%,anatomy of the spine complete,no obvious abnormalities  Assessment and Plan:  Pregnancy: [id number] at [redacted]w[redacted]d 1. Supervision of other normal pregnancy, antepartum (Primary) Doing well, feeling regular movement    2. [redacted] weeks gestation of pregnancy GTT and labs today Follow  up u/s today to complete anatomy, normal    3. Vaginal discharge during pregnancy in second trimester Fern negative  Discussed d/c in pregnancy and when to follow up  - Cervicovaginal ancillary only( [place])  4. History of severe pre-eclampsia Encouraged ASA use Normotensive today, continue monitoring at home     Preterm labor symptoms and general obstetric precautions including but not limited to vaginal bleeding, contractions, leaking of fluid and fetal movement were reviewed in detail with the patient. Please refer to After Visit Summary for other counseling recommendations.   Return in about 2 weeks (around 01/08/2024) for OB VISIT (MD or APP).  Future Appointments  Date Time Provider Department Center  01/07/2024  4:10 PM Ozan, Jennifer, DO [facility]   Susi Eric, FNP

## 2023-12-25 NOTE — Progress Notes (Signed)
 US  27+1 wks,breech,anterior placenta gr 0,normal right ovary,left oophorectomy,FHR 137 bpm,CX 5.2 cm,AFI 22 cm,EFW 1123 g 63%,anatomy of the spine complete,no obvious abnormalities

## 2023-12-26 LAB — CBC
Hematocrit: 32.8 % — ABNORMAL LOW (ref 34.0–46.6)
Hemoglobin: 10.8 g/dL — ABNORMAL LOW (ref 11.1–15.9)
MCH: 29.1 pg (ref 26.6–33.0)
MCHC: 32.9 g/dL (ref 31.5–35.7)
MCV: 88 fL (ref 79–97)
Platelets: 312 10*3/uL (ref 150–450)
RBC: 3.71 x10E6/uL — ABNORMAL LOW (ref 3.77–5.28)
RDW: 12.8 % (ref 11.7–15.4)
WBC: 10.1 10*3/uL (ref 3.4–10.8)

## 2023-12-26 LAB — HIV ANTIBODY (ROUTINE TESTING W REFLEX): HIV Screen 4th Generation wRfx: NONREACTIVE

## 2023-12-26 LAB — ANTIBODY SCREEN: Antibody Screen: NEGATIVE

## 2023-12-26 LAB — GLUCOSE TOLERANCE, 2 HOURS W/ 1HR
Glucose, 1 hour: 155 mg/dL (ref 70–179)
Glucose, 2 hour: 73 mg/dL (ref 70–152)
Glucose, Fasting: 88 mg/dL (ref 70–91)

## 2023-12-26 LAB — RPR: RPR Ser Ql: NONREACTIVE

## 2023-12-28 ENCOUNTER — Ambulatory Visit: Payer: Self-pay | Admitting: Obstetrics and Gynecology

## 2023-12-28 DIAGNOSIS — Z3480 Encounter for supervision of other normal pregnancy, unspecified trimester: Secondary | ICD-10-CM

## 2023-12-29 ENCOUNTER — Ambulatory Visit: Payer: Self-pay | Admitting: Obstetrics and Gynecology

## 2023-12-29 LAB — CERVICOVAGINAL ANCILLARY ONLY
Bacterial Vaginitis (gardnerella): NEGATIVE
Candida Glabrata: NEGATIVE
Candida Vaginitis: POSITIVE — AB
Comment: NEGATIVE
Comment: NEGATIVE
Comment: NEGATIVE

## 2023-12-30 ENCOUNTER — Other Ambulatory Visit: Payer: Self-pay | Admitting: Obstetrics and Gynecology

## 2023-12-30 MED ORDER — FLUCONAZOLE 150 MG PO TABS: 150.0000 mg | ORAL_TABLET | Freq: Once | ORAL | 0 refills | Status: AC

## 2023-12-30 MED ORDER — HYDROCORTISONE (PERIANAL) 2.5 % EX CREA: TOPICAL_CREAM | Freq: Two times a day (BID) | CUTANEOUS | 2 refills | Status: DC

## 2024-01-07 ENCOUNTER — Encounter: Payer: Self-pay | Admitting: Obstetrics & Gynecology

## 2024-01-07 ENCOUNTER — Ambulatory Visit: Admitting: Obstetrics & Gynecology

## 2024-01-07 VITALS — BP 127/76 | HR 80 | Wt 244.0 lb

## 2024-01-07 DIAGNOSIS — O23593 Infection of other part of genital tract in pregnancy, third trimester: Secondary | ICD-10-CM | POA: Diagnosis not present

## 2024-01-07 DIAGNOSIS — Z3A29 29 weeks gestation of pregnancy: Secondary | ICD-10-CM

## 2024-01-07 DIAGNOSIS — N938 Other specified abnormal uterine and vaginal bleeding: Secondary | ICD-10-CM | POA: Diagnosis not present

## 2024-01-07 DIAGNOSIS — Z3480 Encounter for supervision of other normal pregnancy, unspecified trimester: Secondary | ICD-10-CM

## 2024-01-07 NOTE — Progress Notes (Addendum)
 LOW-RISK PREGNANCY VISIT Patient name: Julia Sanders MRN [id number]  Date of birth: November 18, 2000 Chief Complaint:   Routine Prenatal Visit  History of Present Illness:   Julia Sanders is a 23 y.o. [id number] female at [redacted]w[redacted]d with an Estimated Date of Delivery: 03/24/24 being seen today for ongoing management of a low-risk pregnancy.     12/25/2023   10:38 AM 09/10/2023    2:34 PM  Depression screen PHQ 2/9  Decreased Interest 0 1  Down, Depressed, Hopeless 0 1  PHQ - 2 Score 0 2  Altered sleeping 0 1  Tired, decreased energy 0 1  Change in appetite 0 1  Feeling bad or failure about yourself  0 0  Trouble concentrating 0 0  Moving slowly or fidgety/restless 0 0  Suicidal thoughts 0 0  PHQ-9 Score 0 5  Difficult doing work/chores Not difficult at all     Today she reports intermittent episodes of leaking clear to white discharge.  She had mentioned this at her last visit and showed us  a picture today of the amount of leaking.  Discharge does saturate pants, almost appears consistent with incontinence however she notes it does not smell or feel like urine and typically occurs immediately following urination. Contractions: Not present. Vag. Bleeding: None.  Movement: Present.  Review of Systems:   Pertinent items are noted in HPI Denies headaches, visual changes, shortness of breath, chest pain, abdominal pain, severe nausea/vomiting, or problems with urination or bowel movements unless otherwise stated above. Pertinent History Reviewed:  Reviewed past medical,surgical, social, obstetrical and family history.  Reviewed problem list, medications and allergies.  Physical Assessment:   Vitals:   01/07/24 1611  BP: 127/76  Pulse: 80  Weight: 244 lb (110.7 kg)  Body mass index is 31.33 kg/m.        Physical Examination:   General appearance: Well appearing, and in no distress  Mental status: Alert, oriented to person, place, and time  Skin: Warm & dry  Respiratory: Normal  respiratory effort, no distress  Abdomen: Soft, gravid, nontender  Pelvic: Cervical exam performed on sterile speculum exam pooling of clear to white discharge noted.  Ferning test negative cervix appears closed Dilation: Closed      Extremities:  No edema  Psych:  mood and affect appropriate  Fetal Status: Fetal Heart Rate (bpm): 160 Fundal Height: 28 cm Movement: Present    Chaperone: Patient declined    No results found for this or any previous visit (from the past 24 hours).   Assessment & Plan:  1) Low-risk pregnancy [id number] at [redacted]w[redacted]d with an Estimated Date of Delivery: 03/24/24   2) Vaginal discharge/ruled out rupture - No evidence of rupture on exam; however, should discharge or leaking continue advised patient to go to MAU for AmniSure testing   Meds: No orders of the defined types were placed in this encounter.  Labs/procedures today: Fern test negative  Plan:  Continue routine obstetrical care  Next visit: prefers in person    Reviewed: Preterm labor symptoms and general obstetric precautions including but not limited to vaginal bleeding, contractions, leaking of fluid and fetal movement were reviewed in detail with the patient.  All questions were answered. Pt has home bp cuff. Check bp weekly, let us  know if >140/90.   Follow-up: Return in about 3 weeks (around 01/28/2024) for LROB visit.  No orders of the defined types were placed in this encounter.   Jendaya Gossett, DO Attending Obstetrician & Gynecologist, Faculty Practice  Center for [employer], [facility]

## 2024-01-08 ENCOUNTER — Encounter: Admitting: Obstetrics & Gynecology

## 2024-01-22 ENCOUNTER — Encounter: Payer: Self-pay | Admitting: Family Medicine

## 2024-01-25 ENCOUNTER — Encounter (HOSPITAL_COMMUNITY): Payer: Self-pay | Admitting: Obstetrics & Gynecology

## 2024-01-25 ENCOUNTER — Inpatient Hospital Stay (HOSPITAL_COMMUNITY)
Admission: AD | Admit: 2024-01-25 | Discharge: 2024-01-25 | Disposition: A | Discharge status: Home / Self Care | Attending: Obstetrics & Gynecology | Admitting: Obstetrics & Gynecology

## 2024-01-25 ENCOUNTER — Other Ambulatory Visit: Payer: Self-pay

## 2024-01-25 DIAGNOSIS — O26893 Other specified pregnancy related conditions, third trimester: Secondary | ICD-10-CM | POA: Diagnosis not present

## 2024-01-25 DIAGNOSIS — O133 Gestational [pregnancy-induced] hypertension without significant proteinuria, third trimester: Secondary | ICD-10-CM

## 2024-01-25 DIAGNOSIS — R519 Headache, unspecified: Secondary | ICD-10-CM

## 2024-01-25 DIAGNOSIS — Z3A31 31 weeks gestation of pregnancy: Secondary | ICD-10-CM

## 2024-01-25 DIAGNOSIS — O99013 Anemia complicating pregnancy, third trimester: Secondary | ICD-10-CM

## 2024-01-25 HISTORY — DX: Interstitial cystitis (chronic) without hematuria: N30.10

## 2024-01-25 LAB — CBC
HCT: 32.9 % — ABNORMAL LOW (ref 36.0–46.0)
Hemoglobin: 10.7 g/dL — ABNORMAL LOW (ref 12.0–15.0)
MCH: 27.8 pg (ref 26.0–34.0)
MCHC: 32.5 g/dL (ref 30.0–36.0)
MCV: 85.5 fL (ref 80.0–100.0)
Platelets: 332 10*3/uL (ref 150–400)
RBC: 3.85 MIL/uL — ABNORMAL LOW (ref 3.87–5.11)
RDW: 13 % (ref 11.5–15.5)
WBC: 10.2 10*3/uL (ref 4.0–10.5)
nRBC: 0 % (ref 0.0–0.2)

## 2024-01-25 LAB — COMPREHENSIVE METABOLIC PANEL WITH GFR
ALT: 7 U/L (ref 0–44)
AST: 16 U/L (ref 15–41)
Albumin: 2.5 g/dL — ABNORMAL LOW (ref 3.5–5.0)
Alkaline Phosphatase: 86 U/L (ref 38–126)
Anion gap: 13 (ref 5–15)
BUN: <5 mg/dL — ABNORMAL LOW (ref 6–20)
CO2: 20 mmol/L — ABNORMAL LOW (ref 22–32)
Calcium: 8.5 mg/dL — ABNORMAL LOW (ref 8.9–10.3)
Chloride: 102 mmol/L (ref 98–111)
Creatinine, Ser: 0.71 mg/dL (ref 0.44–1.00)
GFR, Estimated: >60 mL/min (ref >60)
Glucose, Bld: 89 mg/dL (ref 70–99)
Potassium: 3.9 mmol/L (ref 3.5–5.1)
Sodium: 135 mmol/L (ref 135–145)
Total Bilirubin: 0.3 mg/dL (ref 0.0–1.2)
Total Protein: 6.6 g/dL (ref 6.5–8.1)

## 2024-01-25 LAB — PROTEIN / CREATININE RATIO, URINE
Creatinine, Urine: 152 mg/dL
Protein Creatinine Ratio: 0.07 mg/mg{creat} (ref 0.00–0.15)
Total Protein, Urine: 11 mg/dL

## 2024-01-25 MED ORDER — ACETAMINOPHEN-CAFFEINE 500-65 MG PO TABS: 2.0000 | ORAL_TABLET | Freq: Four times a day (QID) | ORAL | 0 refills | Status: AC | PRN

## 2024-01-25 MED ORDER — LABETALOL HCL 200 MG PO TABS: 200.0000 mg | ORAL_TABLET | Freq: Two times a day (BID) | ORAL | 1 refills | Status: DC

## 2024-01-25 MED ORDER — METOCLOPRAMIDE HCL 10 MG PO TABS
10.0000 mg | ORAL_TABLET | Freq: Once | ORAL | Status: AC
  Administered 2024-01-25: 10 mg via ORAL
  Filled 2024-01-25: qty 1

## 2024-01-25 MED ORDER — METOCLOPRAMIDE HCL 10 MG PO TABS: 10.0000 mg | ORAL_TABLET | Freq: Four times a day (QID) | ORAL | 0 refills | Status: DC

## 2024-01-25 MED ORDER — FERROUS SULFATE 325 (65 FE) MG PO TABS: 325.0000 mg | ORAL_TABLET | Freq: Every day | ORAL | 0 refills | Status: DC

## 2024-01-25 MED ORDER — ACETAMINOPHEN-CAFFEINE 500-65 MG PO TABS
2.0000 | ORAL_TABLET | Freq: Once | ORAL | Status: AC
  Administered 2024-01-25: 2 via ORAL
  Filled 2024-01-25: qty 2

## 2024-01-25 NOTE — MAU Provider Note (Signed)
 Chief Complaint:  Headache and Hypertension   HPI    Julia Sanders is a 23 y.o. [phone] at [redacted]w[redacted]d who presents to maternity admissions reporting that she was advised to come to MAU for elevated BP's that have been seen and documented on Baby RX's. Patient reports her first elevated BP was on 01/19/24  and have been running 140's/90's this past week. She is also c/o HA x 1 week and no resolve from Tylenol . She does have some blurry vision but reports this has been ongoing x 3-4 weeks. Denies RUQ pain, SOB, or Chest pain. She  has a h/o PreE with SF with her 1st pregnancy and was previously on daily bASA but self dc'd it. She is also reporting some new onset Nausea with her HA's   She denies LOF, VB, or CTX and reports good FM's   Pregnancy Course: [facility] ( Prenatal Records Reviewed)  Past Medical History:  Diagnosis Date   Interstitial cystitis    Pregnancy induced hypertension    OB History  Gravida Para Term Preterm AB Living  3 1  1 1 1   SAB IAB Ectopic Multiple Live Births  1   0 1    # Outcome Date GA Lbr Len/2nd Weight Sex Type Anes PTL Lv  3 Current           2 Preterm 12/16/22 [redacted]w[redacted]d 02:20 / 01:12 3190 g M Vag-Spont EPI  LIV     Complications: Severe pre-eclampsia  1 SAB            Past Surgical History:  Procedure Laterality Date   cyst removed from jaw     TUMOR REMOVAL     Left ovary   Family History  Problem Relation Age of Onset   Healthy Mother    Kidney Stones Father    Drug abuse Maternal Grandmother        overdose   Cirrhosis Paternal Grandmother    Other Paternal Grandfather        blockage in heart   Social History   Tobacco Use   Smoking status: Never   Smokeless tobacco: Never   Tobacco comments:    Quit vaping with +preg  Vaping Use   Vaping status: Former  Substance Use Topics   Alcohol use: No   Drug use: No   No Known Allergies No medications prior to admission.    I have reviewed patient's Past Medical Hx, Surgical Hx,  Family Hx, Social Hx, medications and allergies.   ROS  Pertinent items noted in HPI and remainder of comprehensive ROS otherwise negative.   PHYSICAL EXAM  Patient Vitals for the past 24 hrs:  BP Temp Temp src Pulse Resp SpO2 Height Weight  01/25/24 1330 117/77 -- -- 83 -- -- -- --  01/25/24 1315 131/78 -- -- 81 -- -- -- --  01/25/24 1300 131/81 -- -- 80 -- -- -- --  01/25/24 1245 132/82 -- -- 91 -- -- -- --  01/25/24 1231 133/79 -- -- 89 -- -- -- --  01/25/24 1222 (!) 140/82 -- -- 94 -- -- -- --  01/25/24 1220 -- -- -- -- -- 99 % -- --  01/25/24 1200 (!) 134/93 -- -- 99 -- 99 % -- --  01/25/24 1147 (!) 132/95 -- -- (!) 102 -- -- -- --  01/25/24 1122 (!) 143/90 98.5 F (36.9 C) Oral (!) 104 19 100 % -- --  01/25/24 1114 -- -- -- -- -- -- 6'  1 (1.854 m) 111.2 kg    Constitutional: Well-developed, well-nourished female in no acute distress.  Cardiovascular: normal rate & rhythm, warm and well-perfused Respiratory: normal effort, no problems with respiration noted GI: Abd soft, non-tender, gravid, no ruq pain illicited MS: Extremities nontender, no edema, normal ROM Neurologic: Alert and oriented x 4.  GU: no CVA tenderness Pelvic: Deferred     Fetal Tracing: Cat 1 reactive Baseline: 135-140 Variability: moderate  Accelerations: present Decelerations: absent Toco: no ctx   Labs: Results for orders placed or performed during the hospital encounter of 01/25/24 (from the past 24 hours)  CBC     Status: Abnormal   Collection Time: 01/25/24 11:34 AM  Result Value Ref Range   WBC 10.2 4.0 - 10.5 K/uL   RBC 3.85 (L) 3.87 - 5.11 MIL/uL   Hemoglobin 10.7 (L) 12.0 - 15.0 g/dL   HCT 67.0 (L) 63.9 - 53.9 %   MCV 85.5 80.0 - 100.0 fL   MCH 27.8 26.0 - 34.0 pg   MCHC 32.5 30.0 - 36.0 g/dL   RDW 86.9 88.4 - 84.4 %   Platelets 332 150 - 400 K/uL   nRBC 0.0 0.0 - 0.2 %  Comprehensive metabolic panel     Status: Abnormal   Collection Time: 01/25/24 11:34 AM  Result Value Ref  Range   Sodium 135 135 - 145 mmol/L   Potassium 3.9 3.5 - 5.1 mmol/L   Chloride 102 98 - 111 mmol/L   CO2 20 (L) 22 - 32 mmol/L   Glucose, Bld 89 70 - 99 mg/dL   BUN <5 (L) 6 - 20 mg/dL   Creatinine, Ser 9.28 0.44 - 1.00 mg/dL   Calcium 8.5 (L) 8.9 - 10.3 mg/dL   Total Protein 6.6 6.5 - 8.1 g/dL   Albumin 2.5 (L) 3.5 - 5.0 g/dL   AST 16 15 - 41 U/L   ALT 7 0 - 44 U/L   Alkaline Phosphatase 86 38 - 126 U/L   Total Bilirubin 0.3 0.0 - 1.2 mg/dL   GFR, Estimated >39 >39 mL/min   Anion gap 13 5 - 15  Protein / creatinine ratio, urine     Status: None   Collection Time: 01/25/24 12:35 PM  Result Value Ref Range   Creatinine, Urine 152 mg/dL   Total Protein, Urine 11 mg/dL   Protein Creatinine Ratio 0.07 0.00 - 0.15 mg/mg[Cre]    Imaging:  No results found.  MDM & MAU COURSE  MDM:  HIGH  R/O gHTN, Preeclampsia  - Prenatal records reviewed - BP monitoring ( MRBP's )  - CBC, CMP, P/C Ratio ( HELLP Labs) Labs unremarkable - NST for GA and fetal well being - Excedrin with Reglan  for Headache ( With relief noted)  Patient rules in for gHTN - Will plan to start on Labetalol  200 mg BID and F/U BP check on Thursday 01/28/24 as d/w Dr Jayne [facility] [address] Attending)   Patient should start Antenatal testing at 32 weeks with fetal growth q 4 weeks. Message sent to the office and consults for MFM and OB Cardiology placed    MAU Course: Orders Placed This Encounter  Procedures   CBC   Protein / creatinine ratio, urine   Comprehensive metabolic panel   AMB Referral to Cardio Obstetrics   Consult to Maternal Fetal Care   Discharge patient Discharge disposition: 01-Home or Self Care; Discharge patient date: 01/25/2024   Meds ordered this encounter  Medications   acetaminophen -caffeine (EXCEDRIN TENSION HEADACHE)  500-65 MG per tablet 2 tablet   metoCLOPramide  (REGLAN ) tablet 10 mg   labetalol  (NORMODYNE ) 200 MG tablet    Sig: Take 1 tablet (200 mg total) by mouth 2 (two) times daily.     Dispense:  60 tablet    Refill:  1    Supervising Provider:   PRATT, TANYA S [[id number]]   ferrous sulfate  325 (65 FE) MG tablet    Sig: Take 1 tablet (325 mg total) by mouth daily.    Dispense:  30 tablet    Refill:  0    Supervising Provider:   PRATT, TANYA S [[id number]]   acetaminophen -caffeine (EXCEDRIN TENSION HEADACHE) 500-65 MG TABS per tablet    Sig: Take 2 tablets by mouth 4 (four) times daily as needed (Foe headaches).    Dispense:  60 tablet    Refill:  0    Supervising Provider:   PRATT, TANYA S [[id number]]   metoCLOPramide  (REGLAN ) 10 MG tablet    Sig: Take 1 tablet (10 mg total) by mouth every 6 (six) hours.    Dispense:  30 tablet    Refill:  0    Supervising Provider:   PRATT, TANYA S [[id number]]    I have reviewed the patient chart and performed the physical exam . I have ordered & interpreted the lab results and reviewed and interpreted the NST Medications ordered as stated below.  A/P as described below.  Counseling and education provided and patient agreeable  with plan as described below. Verbalized understanding.    ASSESSMENT   1. Gestational hypertension, third trimester   2. [redacted] weeks gestation of pregnancy   3. Nonintractable headache, unspecified chronicity pattern, unspecified headache type   4. Anemia during pregnancy in third trimester      PLAN   Future Appointments  Date Time Provider Department Center  01/28/2024  3:10 PM Kizzie Suzen SAUNDERS, CNM [facility]     Discharge home in stable condition with return precautions.   OB F/U with BP check Thursday 01/28/24  See AVS for full description of information given to the patient including both verbal and written. Patient verbalized understanding and agrees with the plan as described above.     Follow-up Information     [facility] Follow up.   Specialty: Obstetrics and Gynecology Why: Blood pressure check Thursday 6/26 Contact information: [address] [place]  [address] [phone]                Allergies as of 01/25/2024   No Known Allergies      Medication List     TAKE these medications    acetaminophen  500 MG tablet Commonly known as: TYLENOL  Take 1,000 mg by mouth every 6 (six) hours as needed.   acetaminophen  325 MG tablet Commonly known as: Tylenol  Take 2 tablets (650 mg total) by mouth every 4 (four) hours as needed for mild pain, headache or fever (for pain scale < 4).   acetaminophen -caffeine 500-65 MG Tabs per tablet Commonly known as: EXCEDRIN TENSION HEADACHE Take 2 tablets by mouth 4 (four) times daily as needed (Foe headaches).   aspirin  EC 81 MG tablet Take 2 tablets (162 mg total) by mouth daily. Swallow whole.   ferrous sulfate  325 (65 FE) MG tablet Take 1 tablet (325 mg total) by mouth daily.   hydrocortisone  2.5 % rectal cream Commonly known as: ANUSOL -HC Place rectally 2 (two) times daily.   hydrOXYzine  25 MG tablet Commonly known as: ATARAX Take 50 mg by mouth every evening.   labetalol  200 MG tablet Commonly known as: NORMODYNE  Take 1 tablet (200 mg total) by mouth 2 (two) times daily.   metoCLOPramide  10 MG tablet Commonly known as: REGLAN  Take 1 tablet (10 mg total) by mouth every 6 (six) hours.   prenatal vitamin w/FE, FA 27-1 MG Tabs tablet Take 1 tablet by mouth daily at 12 noon.        Olam Dalton, MSN, [facility], Center for [employer]

## 2024-01-25 NOTE — MAU Note (Signed)
 Lab in for blood work, urine sent.

## 2024-01-25 NOTE — MAU Note (Signed)
 Julia Sanders is a 23 y.o. at [redacted]w[redacted]d here in MAU reporting: she was instructed to be seen in MAU for BP readings documented in [employer].  Also reports she has a HA, dizziness and seeing spots, states has had HA for seven days.  Reports taking Tylenol , no relief noted, last took yesterday around lunchtime.  Denies epigastric pain.  Endorses +FM, denies VB and LOF.  LMP: NA Onset of complaint: today Pain score: 6 Vitals:   01/25/24 1122  BP: (!) 143/90  Pulse: (!) 104  Resp: 19  Temp: 98.5 F (36.9 C)  SpO2: 100%     FHT: 159 bpm, FM audible  Lab orders placed from triage: None

## 2024-01-28 ENCOUNTER — Ambulatory Visit: Admitting: Women's Health

## 2024-01-28 ENCOUNTER — Encounter: Payer: Self-pay | Admitting: Women's Health

## 2024-01-28 VITALS — BP 126/75 | HR 85 | Wt 245.0 lb

## 2024-01-28 DIAGNOSIS — Z3A32 32 weeks gestation of pregnancy: Secondary | ICD-10-CM | POA: Diagnosis not present

## 2024-01-28 DIAGNOSIS — O133 Gestational [pregnancy-induced] hypertension without significant proteinuria, third trimester: Secondary | ICD-10-CM | POA: Diagnosis not present

## 2024-01-28 DIAGNOSIS — O0993 Supervision of high risk pregnancy, unspecified, third trimester: Secondary | ICD-10-CM | POA: Diagnosis not present

## 2024-01-28 DIAGNOSIS — O0990 Supervision of high risk pregnancy, unspecified, unspecified trimester: Secondary | ICD-10-CM

## 2024-01-28 DIAGNOSIS — Z23 Encounter for immunization: Secondary | ICD-10-CM | POA: Diagnosis not present

## 2024-01-28 DIAGNOSIS — O139 Gestational [pregnancy-induced] hypertension without significant proteinuria, unspecified trimester: Secondary | ICD-10-CM | POA: Insufficient documentation

## 2024-01-28 LAB — POCT URINALYSIS DIPSTICK OB
Blood, UA: NEGATIVE
Glucose, UA: NEGATIVE
Ketones, UA: NEGATIVE
Nitrite, UA: NEGATIVE
POC,PROTEIN,UA: NEGATIVE

## 2024-01-28 NOTE — Progress Notes (Signed)
 HIGH-RISK PREGNANCY VISIT Patient name: Julia Sanders MRN [id number]  Date of birth: [birth year] Chief Complaint:   Routine Prenatal Visit  History of Present Illness:   MORGIN HALLS is a 23 y.o. [phone] female at [redacted]w[redacted]d with an Estimated Date of Delivery: 03/24/24 being seen today for ongoing management of a high-risk pregnancy complicated by gestational hypertension currently on labetalol  200mg  BID (started 6/23).    Today she reports no complaints. Denies ha, visual changes, ruq/epigastric pain, n/v.  Wonders why she has cardiology appt. Contractions: Not present. Vag. Bleeding: None.  Movement: Present. denies leaking of fluid.      12/25/2023   10:38 AM 09/10/2023    2:34 PM  Depression screen PHQ 2/9  Decreased Interest 0 1  Down, Depressed, Hopeless 0 1  PHQ - 2 Score 0 2  Altered sleeping 0 1  Tired, decreased energy 0 1  Change in appetite 0 1  Feeling bad or failure about yourself  0 0  Trouble concentrating 0 0  Moving slowly or fidgety/restless 0 0  Suicidal thoughts 0 0  PHQ-9 Score 0 5  Difficult doing work/chores Not difficult at all         12/25/2023   10:39 AM 09/10/2023    2:34 PM  GAD 7 : Generalized Anxiety Score  Nervous, Anxious, on Edge 0 0  Control/stop worrying 0 0  Worry too much - different things 0 0  Trouble relaxing 0 1  Restless 0 0  Easily annoyed or irritable 0 1  Afraid - awful might happen 0 0  Total GAD 7 Score 0 2  Anxiety Difficulty Not difficult at all      Review of Systems:   Pertinent items are noted in HPI Denies abnormal vaginal discharge w/ itching/odor/irritation, headaches, visual changes, shortness of breath, chest pain, abdominal pain, severe nausea/vomiting, or problems with urination or bowel movements unless otherwise stated above. Pertinent History Reviewed:  Reviewed past medical,surgical, social, obstetrical and family history.  Reviewed problem list, medications and allergies. Physical Assessment:    Vitals:   01/28/24 1521  BP: 126/75  Pulse: 85  Weight: 245 lb (111.1 kg)  Body mass index is 32.32 kg/m.           Physical Examination:   General appearance: alert, well appearing, and in no distress  Mental status: alert, oriented to person, place, and time  Skin: warm & dry   Extremities:      Cardiovascular: normal heart rate noted  Respiratory: normal respiratory effort, no distress  Abdomen: gravid, soft, non-tender  Pelvic: Cervical exam deferred         Fetal Status:     Movement: Present    Fetal Surveillance Testing today: NST: FHR baseline 145 bpm, Variability: moderate, Accelerations:present, Decelerations:  Absent= Cat 1/reactive Toco: none    Chaperone: N/A  Results for orders placed or performed in visit on 01/28/24 (from the past 24 hours)  POC Urinalysis Dipstick OB   Collection Time: 01/28/24  3:44 PM  Result Value Ref Range   Color, UA     Clarity, UA     Glucose, UA Negative Negative   Bilirubin, UA     Ketones, UA neg    Spec Grav, UA     Blood, UA neg    pH, UA     POC,PROTEIN,UA Negative Negative, Trace, Small (1+), Moderate (2+), Large (3+), 4+   Urobilinogen, UA     Nitrite, UA neg    Leukocytes,  UA Small (1+) (A) Negative   Appearance     Odor      Assessment & Plan:  High-risk pregnancy: [id number] at [redacted]w[redacted]d with an Estimated Date of Delivery: 03/24/24   1) GHTN w/ h/o severe pre-e, stable on labetalol  200mg  BID, check bp bid, reviewed pre-e s/s, reasons to seek care. OK to cancel OB cards appt per [facility]ure  Meds: No orders of the defined types were placed in this encounter.   Labs/procedures today: NST  Treatment Plan:  EFW q4w    2x/wk testing   IOL 37w  Reviewed: Preterm labor symptoms and general obstetric precautions including but not limited to vaginal bleeding, contractions, leaking of fluid and fetal movement were reviewed in detail with the patient.  All questions were answered. Does have home bp cuff. Office bp cuff given: not  applicable. Check bp twice daily, let us  know if consistently >140 and/or >90.  Follow-up: Return for Mon bpp/dopp HROB MD/CNM, Tamara NST/nurse til 37w.   Future Appointments  Date Time Provider Department Center  02/19/2024  8:00 AM Tobb, Kardie, DO [facility]    Orders Placed This Encounter  Procedures   Tdap vaccine greater than or equal to 7yo IM   POC Urinalysis Dipstick OB   Suzen JONELLE Fetters CNM, [facility] 01/28/2024 4:29 PM

## 2024-01-28 NOTE — Patient Instructions (Signed)
 Toni Amend, thank you for choosing our office today! We appreciate the opportunity to meet your healthcare needs. You may receive a short survey by mail, e-mail, or through [employer]. If you are happy with your care we would appreciate if you could take just a few minutes to complete the survey questions. We read all of your comments and take your feedback very seriously. Thank you again for choosing our office.  Center for [employer] Team at [facility]  [facility] at [facility] ([address] [place], [place] [address]) Entrance C, located off of [address] Free 24/7 valet parking   CLASSES: Go to [email or web address] to register for classes (childbirth, breastfeeding, waterbirth, infant CPR, daddy bootcamp, etc.)  Call the office [phone]) or go to [facility] if: You begin to have strong, frequent contractions Your water breaks.  Sometimes it is a big gush of fluid, sometimes it is just a trickle that keeps getting your panties wet or running down your legs You have vaginal bleeding.  It is normal to have a small amount of spotting if your cervix was checked.  You don't feel your baby moving like normal.  If you don't, get you something to eat and drink and lay down and focus on feeling your baby move.   If your baby is still not moving like normal, you should call the office or go to [facility].  Call the office [phone]) or go to [facility] hospital for these signs of pre-eclampsia: Severe headache that does not go away with Tylenol Visual changes- seeing spots, double, blurred vision Pain under your right breast or upper abdomen that does not go away with Tums or heartburn medicine Nausea and/or vomiting Severe swelling in your hands, feet, and face   Tdap Vaccine It is recommended that you get the Tdap vaccine during the third trimester of EACH pregnancy to help protect your baby from getting pertussis (whooping cough) 27-36 weeks is the BEST time to do  this so that you can pass the protection on to your baby. During pregnancy is better than after pregnancy, but if you are unable to get it during pregnancy it will be offered at the hospital.  You can get this vaccine with [place], at the health department, your family doctor, or some local pharmacies Everyone who will be around your baby should also be up-to-date on their vaccines before the baby comes. Adults (who are not pregnant) only need 1 dose of Tdap during adulthood.   [facility] Pediatricians/Family Doctors [facility]): [address] Colette Ribas, [phone]           [facility]: [address], [phone]                [facility]): [address], [phone] (call to ask if accepting patients) [facility] Department: [address], [place], [phone]    [facility] Pediatricians/Family Doctors [facility]): [phone] [address], [phone] [facility]: [address] [place], [phone] [facility]: [address], [phone]  [facility] Doctors  [facility]): [phone] [facility]: [address] [phone]   [facility] Doctors [facility]: [address], [phone]  [facility] Family Doctors  [facility]: [phone], [phone]  Home Blood Pressure Monitoring for Patients   Your provider has recommended that you check your  blood pressure (BP) at least once a week at home. If you do not have a blood pressure cuff at home, one will be provided for you. Contact your provider if you have not received your monitor within 1 week.   Helpful Tips for Accurate Home Blood Pressure Checks  Don't smoke, exercise, or drink caffeine 30 minutes before checking your BP Use the restroom before checking your BP (a full bladder can raise your  pressure) Relax in a comfortable upright chair Feet on the ground Left arm resting comfortably on a flat surface at the level of your heart Legs uncrossed Back supported Sit quietly and don't talk Place the cuff on your bare arm Adjust snuggly, so that only two fingertips can fit between your skin and the top of the cuff Check 2 readings separated by at least one minute Keep a log of your BP readings For a visual, please reference this diagram: http://ccnc.care/bpdiagram  Provider Name: [facility]     Phone: [phone]  Zone 1: ALL CLEAR  Continue to monitor your symptoms:  BP reading is less than 140 (top number) or less than 90 (bottom number)  No right upper stomach pain No headaches or seeing spots No feeling nauseated or throwing up No swelling in face and hands  Zone 2: CAUTION Call your doctor's office for any of the following:  BP reading is greater than 140 (top number) or greater than 90 (bottom number)  Stomach pain under your ribs in the middle or right side Headaches or seeing spots Feeling nauseated or throwing up Swelling in face and hands  Zone 3: EMERGENCY  Seek immediate medical care if you have any of the following:  BP reading is greater than160 (top number) or greater than 110 (bottom number) Severe headaches not improving with Tylenol Serious difficulty catching your breath Any worsening symptoms from Zone 2  Preterm Labor and Birth Information  The normal length of a pregnancy is 39-41 weeks. Preterm labor is when labor starts before 37 completed weeks of pregnancy. What are the risk factors for preterm labor? Preterm labor is more likely to occur in women who: Have certain infections during pregnancy such as a bladder infection, sexually transmitted infection, or infection inside the uterus (chorioamnionitis). Have a shorter-than-normal cervix. Have gone into preterm labor before. Have had surgery on their cervix. Are younger than age 68  or older than age 60. Are African American. Are pregnant with twins or multiple babies (multiple gestation). Take street drugs or smoke while pregnant. Do not gain enough weight while pregnant. Became pregnant shortly after having been pregnant. What are the symptoms of preterm labor? Symptoms of preterm labor include: Cramps similar to those that can happen during a menstrual period. The cramps may happen with diarrhea. Pain in the abdomen or lower back. Regular uterine contractions that may feel like tightening of the abdomen. A feeling of increased pressure in the pelvis. Increased watery or bloody mucus discharge from the vagina. Water breaking (ruptured amniotic sac). Why is it important to recognize signs of preterm labor? It is important to recognize signs of preterm labor because babies who are born prematurely may not be fully developed. This can put them at an increased risk for: Long-term (chronic) heart and lung problems. Difficulty immediately after birth with regulating body systems, including blood sugar, body temperature, heart rate, and breathing rate. Bleeding in the brain. Cerebral palsy. Learning difficulties. Death. These risks are highest for babies who are born before 34 weeks  of pregnancy. How is preterm labor treated? Treatment depends on the length of your pregnancy, your condition, and the health of your baby. It may involve: Having a stitch (suture) placed in your cervix to prevent your cervix from opening too early (cerclage). Taking or being given medicines, such as: Hormone medicines. These may be given early in pregnancy to help support the pregnancy. Medicine to stop contractions. Medicines to help mature the baby's lungs. These may be prescribed if the risk of delivery is high. Medicines to prevent your baby from developing cerebral palsy. If the labor happens before 34 weeks of pregnancy, you may need to stay in the hospital. What should I do if I  think I am in preterm labor? If you think that you are going into preterm labor, call your health care provider right away. How can I prevent preterm labor in future pregnancies? To increase your chance of having a full-term pregnancy: Do not use any tobacco products, such as cigarettes, chewing tobacco, and e-cigarettes. If you need help quitting, ask your health care provider. Do not use street drugs or medicines that have not been prescribed to you during your pregnancy. Talk with your health care provider before taking any herbal supplements, even if you have been taking them regularly. Make sure you gain a healthy amount of weight during your pregnancy. Watch for infection. If you think that you might have an infection, get it checked right away. Make sure to tell your health care provider if you have gone into preterm labor before. This information is not intended to replace advice given to you by your health care provider. Make sure you discuss any questions you have with your health care provider. Document Revised: 11/12/2018 Document Reviewed: 12/12/2015 Elsevier Patient Education  2020 [employer].

## 2024-02-01 ENCOUNTER — Other Ambulatory Visit

## 2024-02-01 ENCOUNTER — Encounter: Payer: Self-pay | Admitting: Women's Health

## 2024-02-04 ENCOUNTER — Other Ambulatory Visit: Payer: Self-pay | Admitting: Obstetrics & Gynecology

## 2024-02-04 ENCOUNTER — Other Ambulatory Visit

## 2024-02-04 ENCOUNTER — Ambulatory Visit: Admitting: Obstetrics & Gynecology

## 2024-02-04 ENCOUNTER — Other Ambulatory Visit: Payer: Self-pay | Admitting: Radiology

## 2024-02-04 ENCOUNTER — Encounter: Payer: Self-pay | Admitting: Obstetrics & Gynecology

## 2024-02-04 VITALS — BP 118/72 | HR 79 | Wt 245.0 lb

## 2024-02-04 DIAGNOSIS — O0993 Supervision of high risk pregnancy, unspecified, third trimester: Secondary | ICD-10-CM

## 2024-02-04 DIAGNOSIS — O133 Gestational [pregnancy-induced] hypertension without significant proteinuria, third trimester: Secondary | ICD-10-CM

## 2024-02-04 DIAGNOSIS — Z3A33 33 weeks gestation of pregnancy: Secondary | ICD-10-CM | POA: Diagnosis not present

## 2024-02-04 DIAGNOSIS — N3010 Interstitial cystitis (chronic) without hematuria: Secondary | ICD-10-CM | POA: Diagnosis not present

## 2024-02-04 LAB — POCT URINALYSIS DIPSTICK OB
Blood, UA: NEGATIVE
Glucose, UA: NEGATIVE
Ketones, UA: NEGATIVE
Nitrite, UA: NEGATIVE
POC,PROTEIN,UA: NEGATIVE

## 2024-02-04 MED ORDER — HYDROXYZINE HCL 25 MG PO TABS: 25.0000 mg | ORAL_TABLET | Freq: Three times a day (TID) | ORAL | 3 refills | Status: AC | PRN

## 2024-02-04 NOTE — Progress Notes (Signed)
 HIGH-RISK PREGNANCY VISIT Patient name: Julia Sanders MRN [id number]  Date of birth: June 23, 2001 Chief Complaint:   Non-stress Test and Routine Prenatal Visit  History of Present Illness:   Julia Sanders is a 23 y.o. [id number] female at [redacted]w[redacted]d with an Estimated Date of Delivery: 03/24/24 being seen today for ongoing management of a high-risk pregnancy complicated by:  -Gest HTN Occasional headaches/blurry vision, but better than previously.  Taking Fioricet when needed  Concern for possible UTI- notes urinary frequency, burning, no hematuria  Contractions: Not present. Vag. Bleeding: None.  Movement: Present. denies leaking of fluid.      12/25/2023   10:38 AM 09/10/2023    2:34 PM  Depression screen PHQ 2/9  Decreased Interest 0 1  Down, Depressed, Hopeless 0 1  PHQ - 2 Score 0 2  Altered sleeping 0 1  Tired, decreased energy 0 1  Change in appetite 0 1  Feeling bad or failure about yourself  0 0  Trouble concentrating 0 0  Moving slowly or fidgety/restless 0 0  Suicidal thoughts 0 0  PHQ-9 Score 0 5  Difficult doing work/chores Not difficult at all      Current Outpatient Medications  Medication Instructions   acetaminophen  (TYLENOL ) 650 mg, Oral, Every 4 hours PRN   acetaminophen  (TYLENOL ) 1,000 mg, Every 6 hours PRN   acetaminophen -caffeine  (EXCEDRIN TENSION HEADACHE) 500-65 MG TABS per tablet 2 tablets, Oral, 4 times daily PRN   aspirin  EC 162 mg, Oral, Daily, Swallow whole.   ferrous sulfate  325 mg, Oral, Daily   hydrocortisone  (ANUSOL -HC) 2.5 % rectal cream Rectal, 2 times daily   hydrOXYzine (ATARAX) 50 mg, Every evening   labetalol  (NORMODYNE ) 200 mg, Oral, 2 times daily   metoCLOPramide  (REGLAN ) 10 mg, Oral, Every 6 hours   prenatal vitamin w/FE, FA (PRENATAL 1 + 1) 27-1 MG TABS tablet 1 tablet, Oral, Daily     Review of Systems:   Pertinent items are noted in HPI Denies abnormal vaginal discharge w/ itching/odor/irritation, shortness of breath, chest  pain, abdominal pain, severe nausea/vomiting Pertinent History Reviewed:  Reviewed past medical,surgical, social, obstetrical and family history.  Reviewed problem list, medications and allergies. Physical Assessment:   Vitals:   02/04/24 1039  BP: 118/72  Pulse: 79  Weight: 245 lb (111.1 kg)  Body mass index is 32.32 kg/m.           Physical Examination:   General appearance: alert, well appearing, and in no distress  Mental status: normal mood, behavior, speech, dress, motor activity, and thought processes  Skin: warm & dry   Extremities: Edema: None    Cardiovascular: normal heart rate noted  Respiratory: normal respiratory effort, no distress  Abdomen: gravid, soft, non-tender  Pelvic: Cervical exam deferred         Fetal Status:     Movement: Present    Fetal Surveillance Testing today: NST  NST being performed due to [facility]   Fetal Monitoring:  Baseline: 130 bpm, Variability: moderate, Accelerations: present, The accelerations are >15 bpm and more than 2 in 20 minutes, and Decelerations: Absent     Final diagnosis:  Reactive NST      Chaperone: N/A    No results found for this or any previous visit (from the past 24 hours).   Assessment & Plan:  High-risk pregnancy: [id number] at [redacted]w[redacted]d with an Estimated Date of Delivery: 03/24/24   1. [redacted] weeks gestation of pregnancy   2. Gestational hypertension, third trimester Reactive NST  Continue antepartum testing Reviewed precautions, continue Labetalol  200mg  bid []  IOL @ 37wk  3. Interstitial cystitis/ Concern for UTI -UA negative -plan to restart hydroxyzine   Meds: No orders of the defined types were placed in this encounter.   Labs/procedures today: NST  Treatment Plan:  routine OB care and as outlined above  Reviewed: Preterm labor symptoms and general obstetric precautions including but not limited to vaginal bleeding, contractions, leaking of fluid and fetal movement were reviewed in detail with the  patient.  All questions were answered. Pt has home bp cuff. Check bp weekly, let us  know if >140/90.   Follow-up: Return for twice weekly and HROB as scheduled.   Future Appointments  Date Time Provider Department Center  02/04/2024 10:50 AM Casimira Sutphin, DO [facility]  02/08/2024  2:15 PM [facility] US  [facility] None  02/08/2024  3:10 PM Newton Mering, CNM [facility]  02/11/2024  3:30 PM [facility] NURSE [facility]  02/15/2024  3:00 PM [facility] US  [facility] None  02/15/2024  3:50 PM Jayne Vonn DEL, MD [facility]  02/18/2024  3:10 PM [facility] NURSE [facility]  02/19/2024  8:00 AM Tobb, Kardie, DO [facility]  02/22/2024  3:00 PM [facility] US  [facility] None  02/22/2024  3:50 PM Kizzie Suzen SAUNDERS, CNM [facility]  02/25/2024  3:30 PM [facility] NURSE [facility]  02/29/2024  9:15 AM [facility] US  [facility] None  02/29/2024 10:10 AM Marilynn Nest, DO [facility]  03/03/2024  3:30 PM [facility] NURSE [facility]    No orders of the defined types were placed in this encounter.   Seanna Sisler, DO Attending Obstetrician & Gynecologist, [facility] for [employer], [facility]

## 2024-02-08 ENCOUNTER — Ambulatory Visit

## 2024-02-08 ENCOUNTER — Ambulatory Visit: Admitting: Advanced Practice Midwife

## 2024-02-08 VITALS — BP 129/77 | HR 87 | Wt 247.0 lb

## 2024-02-08 DIAGNOSIS — O133 Gestational [pregnancy-induced] hypertension without significant proteinuria, third trimester: Secondary | ICD-10-CM | POA: Diagnosis not present

## 2024-02-08 DIAGNOSIS — Z3A33 33 weeks gestation of pregnancy: Secondary | ICD-10-CM

## 2024-02-08 DIAGNOSIS — O0993 Supervision of high risk pregnancy, unspecified, third trimester: Secondary | ICD-10-CM | POA: Diagnosis not present

## 2024-02-08 DIAGNOSIS — O0990 Supervision of high risk pregnancy, unspecified, unspecified trimester: Secondary | ICD-10-CM

## 2024-02-08 NOTE — Progress Notes (Signed)
 HIGH-RISK PREGNANCY VISIT Patient name: Julia Sanders MRN [id number]  Date of birth: [birth year] Chief Complaint:   Routine Prenatal Visit  History of Present Illness:   Julia Sanders is a 23 y.o. [phone] female at 109w4d with an Estimated Date of Delivery: 03/24/24 being seen today for ongoing management of a high-risk pregnancy complicated by gestational hypertension currently on labetalol  200mg  BID.    Today she reports no complaints. Contractions: Not present. Vag. Bleeding: None.  Movement: Present. denies leaking of fluid.      12/25/2023   10:38 AM 09/10/2023    2:34 PM  Depression screen PHQ 2/9  Decreased Interest 0 1  Down, Depressed, Hopeless 0 1  PHQ - 2 Score 0 2  Altered sleeping 0 1  Tired, decreased energy 0 1  Change in appetite 0 1  Feeling bad or failure about yourself  0 0  Trouble concentrating 0 0  Moving slowly or fidgety/restless 0 0  Suicidal thoughts 0 0  PHQ-9 Score 0 5  Difficult doing work/chores Not difficult at all         12/25/2023   10:39 AM 09/10/2023    2:34 PM  GAD 7 : Generalized Anxiety Score  Nervous, Anxious, on Edge 0 0  Control/stop worrying 0 0  Worry too much - different things 0 0  Trouble relaxing 0 1  Restless 0 0  Easily annoyed or irritable 0 1  Afraid - awful might happen 0 0  Total GAD 7 Score 0 2  Anxiety Difficulty Not difficult at all      Review of Systems:   Pertinent items are noted in HPI Denies abnormal vaginal discharge w/ itching/odor/irritation, headaches, visual changes, shortness of breath, chest pain, abdominal pain, severe nausea/vomiting, or problems with urination or bowel movements unless otherwise stated above. Pertinent History Reviewed:  Reviewed past medical,surgical, social, obstetrical and family history.  Reviewed problem list, medications and allergies. Physical Assessment:   Vitals:   02/08/24 1517  BP: 129/77  Pulse: 87  Weight: 247 lb (112 kg)  Body mass index is 32.59  kg/m.           Physical Examination:   General appearance: alert, well appearing, and in no distress  Mental status: alert, oriented to person, place, and time  Skin: warm & dry   Extremities:      Cardiovascular: normal heart rate noted  Respiratory: normal respiratory effort, no distress  Abdomen: gravid, soft, non-tender  Pelvic: Cervical exam deferred         Chaperone: Joen, RN    Fetal Status:     Movement: Present    Fetal Surveillance Testing today: US  33+4 wks,cephalic,FHR 148 bpm,anterior placenta gr 1,BPP 8/8,EFW 2546 g 81%,RI .64,.64,.58=66%,AFI 13 CM      No results found for this or any previous visit (from the past 24 hours).  Assessment & Plan:  High-risk pregnancy: [id number] at [redacted]w[redacted]d with an Estimated Date of Delivery: 03/24/24   1. Gestational hypertension, third trimester (Primary) Continue labetalol , twice weekly testing  2. Supervision of high risk pregnancy, antepartum   3. [redacted] weeks gestation of pregnancy     Meds: No orders of the defined types were placed in this encounter.   Orders: No orders of the defined types were placed in this encounter.    Labs/procedures today: U/S   Reviewed: Preterm labor symptoms and general obstetric precautions including but not limited to vaginal bleeding, contractions, leaking of fluid and fetal movement  were reviewed in detail with the patient.  All questions were answered. Does have home bp cuff. Office bp cuff given: not applicable. Check bp daily, let us  know if consistently >140 and/or >90.  Follow-up: No follow-ups on file.   Future Appointments  Date Time Provider Department Center  02/11/2024  3:30 PM [facility] NURSE [facility]  02/15/2024  3:00 PM [facility] US  [facility] None  02/15/2024  3:50 PM Jayne Vonn DEL, MD [facility]  02/18/2024  3:10 PM [facility] NURSE [facility]  02/19/2024  8:00 AM Tobb, Kardie, DO [facility] H&V  02/22/2024  3:00 PM [facility] US  [facility] None   02/22/2024  3:50 PM Kizzie Suzen SAUNDERS, CNM [facility]  02/25/2024  3:30 PM [facility] NURSE [facility]  02/29/2024  9:15 AM [facility] US  [facility] None  02/29/2024 10:10 AM Marilynn Nest, DO [facility]  03/03/2024  3:30 PM [facility] NURSE [facility]    No orders of the defined types were placed in this encounter.  Cathlean Ely , DNP, CNM [facility] 02/08/2024 3:39 PM

## 2024-02-08 NOTE — Progress Notes (Addendum)
 US  33+4 wks,cephalic,FHR 148 bpm,anterior placenta gr 1,BPP 8/8,EFW 2546 g 81%,RI .64,.64,.58=66%,AFI 13 CM

## 2024-02-11 ENCOUNTER — Ambulatory Visit: Admitting: *Deleted

## 2024-02-11 VITALS — BP 132/76 | HR 90

## 2024-02-11 DIAGNOSIS — Z3A34 34 weeks gestation of pregnancy: Secondary | ICD-10-CM | POA: Diagnosis not present

## 2024-02-11 DIAGNOSIS — O133 Gestational [pregnancy-induced] hypertension without significant proteinuria, third trimester: Secondary | ICD-10-CM | POA: Diagnosis not present

## 2024-02-11 NOTE — Progress Notes (Signed)
   NURSE VISIT- NST  SUBJECTIVE:  Julia Sanders is a 23 y.o. [phone] female at [redacted]w[redacted]d, here for a NST for pregnancy complicated by [facility].  She reports active fetal movement, contractions: none, vaginal bleeding: none, membranes: intact.   OBJECTIVE:  BP 132/76   Pulse 90   LMP  (LMP Unknown)   Appears well, no apparent distress  No results found for this or any previous visit (from the past 24 hours).  NST: FHR baseline 135 bpm, Variability: moderate, Accelerations:present, Decelerations:  Absent= Cat 1/reactive Toco: none   ASSESSMENT: [id number] at [redacted]w[redacted]d with GHTN NST reactive  PLAN: EFM strip reviewed by Dr. Jayne   Recommendations: keep next appointment as scheduled    Alan LITTIE Fischer  02/11/2024 3:52 PM

## 2024-02-12 ENCOUNTER — Other Ambulatory Visit: Payer: Self-pay | Admitting: Obstetrics & Gynecology

## 2024-02-12 DIAGNOSIS — O133 Gestational [pregnancy-induced] hypertension without significant proteinuria, third trimester: Secondary | ICD-10-CM

## 2024-02-15 ENCOUNTER — Ambulatory Visit (INDEPENDENT_AMBULATORY_CARE_PROVIDER_SITE_OTHER): Admitting: Obstetrics & Gynecology

## 2024-02-15 ENCOUNTER — Encounter: Payer: Self-pay | Admitting: Obstetrics & Gynecology

## 2024-02-15 ENCOUNTER — Other Ambulatory Visit

## 2024-02-15 VITALS — BP 126/84 | HR 85 | Wt 252.0 lb

## 2024-02-15 DIAGNOSIS — O133 Gestational [pregnancy-induced] hypertension without significant proteinuria, third trimester: Secondary | ICD-10-CM

## 2024-02-15 DIAGNOSIS — Z3A34 34 weeks gestation of pregnancy: Secondary | ICD-10-CM

## 2024-02-15 DIAGNOSIS — O0990 Supervision of high risk pregnancy, unspecified, unspecified trimester: Secondary | ICD-10-CM

## 2024-02-15 NOTE — Progress Notes (Signed)
 HIGH-RISK PREGNANCY VISIT Patient name: Julia Sanders MRN [id number]  Date of birth: [birth year] Chief Complaint:   Routine Prenatal Visit  History of Present Illness:   Julia Sanders is a 23 y.o. H6E9888 female at [redacted]w[redacted]d with an Estimated Date of Delivery: 03/24/24 being seen today for ongoing management of a high-risk pregnancy complicated by     ICD-10-CM   1. Supervision of high risk pregnancy, antepartum  O09.90     2. Gestational hypertension, third trimester: labetalol  200 BID  O13.3     3. [redacted] weeks gestation of pregnancy [Z3A.34]  Z3A.34      .    Today she reports no complaints. Contractions: Not present. Vag. Bleeding: None.  Movement: Present. denies leaking of fluid.      12/25/2023   10:38 AM 09/10/2023    2:34 PM  Depression screen PHQ 2/9  Decreased Interest 0 1  Down, Depressed, Hopeless 0 1  PHQ - 2 Score 0 2  Altered sleeping 0 1  Tired, decreased energy 0 1  Change in appetite 0 1  Feeling bad or failure about yourself  0 0  Trouble concentrating 0 0  Moving slowly or fidgety/restless 0 0  Suicidal thoughts 0 0  PHQ-9 Score 0 5  Difficult doing work/chores Not difficult at all         12/25/2023   10:39 AM 09/10/2023    2:34 PM  GAD 7 : Generalized Anxiety Score  Nervous, Anxious, on Edge 0 0  Control/stop worrying 0 0  Worry too much - different things 0 0  Trouble relaxing 0 1  Restless 0 0  Easily annoyed or irritable 0 1  Afraid - awful might happen 0 0  Total GAD 7 Score 0 2  Anxiety Difficulty Not difficult at all      Review of Systems:   Pertinent items are noted in HPI Denies abnormal vaginal discharge w/ itching/odor/irritation, headaches, visual changes, shortness of breath, chest pain, abdominal pain, severe nausea/vomiting, or problems with urination or bowel movements unless otherwise stated above. Pertinent History Reviewed:  Reviewed past medical,surgical, social, obstetrical and family history.  Reviewed problem  list, medications and allergies. Physical Assessment:   Vitals:   02/15/24 1539  BP: 126/84  Pulse: 85  Weight: 252 lb (114.3 kg)  Body mass index is 33.25 kg/m.           Physical Examination:   General appearance: alert, well appearing, and in no distress  Mental status: alert, oriented to person, place, and time  Skin: warm & dry   Extremities:      Cardiovascular: normal heart rate noted  Respiratory: normal respiratory effort, no distress  Abdomen: gravid, soft, non-tender  Pelvic: Cervical exam deferred         Fetal Status:     Movement: Present    Fetal Surveillance Testing today: BPP 8/8 UAD 31%   Chaperone: N/A    No results found for this or any previous visit (from the past 24 hours).  Assessment & Plan:  High-risk pregnancy: H6E9888 at [redacted]w[redacted]d with an Estimated Date of Delivery: 03/24/24      ICD-10-CM   1. Supervision of high risk pregnancy, antepartum  O09.90     2. Gestational hypertension, third trimester: labetalol  200 BID  O13.3     3. [redacted] weeks gestation of pregnancy [Z3A.34]  Z3A.34          Meds: No orders of the defined types were placed  in this encounter.   Orders: No orders of the defined types were placed in this encounter.    Labs/procedures today: U/S  Treatment Plan:  keep scheduled visits and surveillance schedule, planned IOL 37 weeks  Reviewed: Preterm labor symptoms and general obstetric precautions including but not limited to vaginal bleeding, contractions, leaking of fluid and fetal movement were reviewed in detail with the patient.  All questions were answered. Does have home bp cuff. Office bp cuff given: not applicable. Check bp daily, let us  know if consistently >150 and/or >95.  Follow-up: Return for keep scheduled.   Future Appointments  Date Time Provider Department Center  02/18/2024  3:10 PM [facility] NURSE [facility]  02/19/2024  8:00 AM Tobb, Kardie, DO [facility] H&V  02/22/2024  3:00 PM [facility] - [facility] US   [facility] None  02/22/2024  3:50 PM Kizzie Suzen SAUNDERS, CNM [facility]  02/25/2024  3:30 PM [facility] NURSE [facility]  02/29/2024  9:15 AM [facility] US  [facility] None  02/29/2024 10:10 AM Marilynn Nest, DO [facility]  03/03/2024  3:30 PM [facility] NURSE [facility]    No orders of the defined types were placed in this encounter.  Vonn VEAR Inch  Attending Physician for the Center for [facility] 02/15/2024 4:38 PM

## 2024-02-15 NOTE — Progress Notes (Signed)
 US  34+4 wks,cephalic,anterior placenta gr 1,AFI 15 cm,FHR 157 BPM,RI .59,.52,.53=31%,BPP 8/8

## 2024-02-18 ENCOUNTER — Other Ambulatory Visit

## 2024-02-18 ENCOUNTER — Encounter: Payer: Self-pay | Admitting: *Deleted

## 2024-02-19 ENCOUNTER — Ambulatory Visit: Attending: Cardiology | Admitting: Cardiology

## 2024-02-19 ENCOUNTER — Other Ambulatory Visit: Payer: Self-pay | Admitting: Obstetrics & Gynecology

## 2024-02-19 DIAGNOSIS — O133 Gestational [pregnancy-induced] hypertension without significant proteinuria, third trimester: Secondary | ICD-10-CM

## 2024-02-22 ENCOUNTER — Ambulatory Visit

## 2024-02-22 ENCOUNTER — Encounter: Payer: Self-pay | Admitting: Women's Health

## 2024-02-22 ENCOUNTER — Ambulatory Visit: Admitting: Women's Health

## 2024-02-22 ENCOUNTER — Other Ambulatory Visit (HOSPITAL_COMMUNITY)
Admission: RE | Admit: 2024-02-22 | Discharge: 2024-02-22 | Disposition: A | Discharge status: Home / Self Care | Source: Ambulatory Visit | Attending: Women's Health | Admitting: Women's Health

## 2024-02-22 VITALS — BP 125/86 | HR 88 | Wt 252.0 lb

## 2024-02-22 DIAGNOSIS — O0993 Supervision of high risk pregnancy, unspecified, third trimester: Secondary | ICD-10-CM | POA: Diagnosis not present

## 2024-02-22 DIAGNOSIS — O0990 Supervision of high risk pregnancy, unspecified, unspecified trimester: Secondary | ICD-10-CM

## 2024-02-22 DIAGNOSIS — Z3A35 35 weeks gestation of pregnancy: Secondary | ICD-10-CM | POA: Insufficient documentation

## 2024-02-22 DIAGNOSIS — O133 Gestational [pregnancy-induced] hypertension without significant proteinuria, third trimester: Secondary | ICD-10-CM

## 2024-02-22 LAB — POCT URINALYSIS DIPSTICK OB
Blood, UA: NEGATIVE
Glucose, UA: NEGATIVE
Ketones, UA: NEGATIVE
Nitrite, UA: NEGATIVE
POC,PROTEIN,UA: NEGATIVE

## 2024-02-22 NOTE — Patient Instructions (Signed)
 Julia Sanders, thank you for choosing our office today! We appreciate the opportunity to meet your healthcare needs. You may receive a short survey by mail, e-mail, or through [employer]. If you are happy with your care we would appreciate if you could take just a few minutes to complete the survey questions. We read all of your comments and take your feedback very seriously. Thank you again for choosing our office.  Center for [employer] Team at [facility]  [facility] at [facility] ([address] [place], [place] [address]) Entrance C, located off of [address] Free 24/7 valet parking   CLASSES: Go to [email or web address] to register for classes (childbirth, breastfeeding, waterbirth, infant CPR, daddy bootcamp, etc.)  Call the office [phone]) or go to [facility] if: You begin to have strong, frequent contractions Your water breaks.  Sometimes it is a big gush of fluid, sometimes it is just a trickle that keeps getting your panties wet or running down your legs You have vaginal bleeding.  It is normal to have a small amount of spotting if your cervix was checked.  You don't feel your baby moving like normal.  If you don't, get you something to eat and drink and lay down and focus on feeling your baby move.   If your baby is still not moving like normal, you should call the office or go to [facility].  Call the office [phone]) or go to [facility] hospital for these signs of pre-eclampsia: Severe headache that does not go away with Tylenol Visual changes- seeing spots, double, blurred vision Pain under your right breast or upper abdomen that does not go away with Tums or heartburn medicine Nausea and/or vomiting Severe swelling in your hands, feet, and face   Tdap Vaccine It is recommended that you get the Tdap vaccine during the third trimester of EACH pregnancy to help protect your baby from getting pertussis (whooping cough) 27-36 weeks is the BEST time to do  this so that you can pass the protection on to your baby. During pregnancy is better than after pregnancy, but if you are unable to get it during pregnancy it will be offered at the hospital.  You can get this vaccine with [place], at the health department, your family doctor, or some local pharmacies Everyone who will be around your baby should also be up-to-date on their vaccines before the baby comes. Adults (who are not pregnant) only need 1 dose of Tdap during adulthood.   [facility] Pediatricians/Family Doctors [facility]): [address] Colette Ribas, [phone]           [facility]: [address], [phone]                [facility]): [address], [phone] (call to ask if accepting patients) [facility] Department: [address], [place], [phone]    [facility] Pediatricians/Family Doctors [facility]): [phone] [address], [phone] [facility]: [address] [place], [phone] [facility]: [address], [phone]  [facility] Doctors  [facility]): [phone] [facility]: [address] [phone]   [facility] Doctors [facility]: [address], [phone]  [facility] Family Doctors  [facility]: [phone], [phone]  Home Blood Pressure Monitoring for Patients   Your provider has recommended that you check your  blood pressure (BP) at least once a week at home. If you do not have a blood pressure cuff at home, one will be provided for you. Contact your provider if you have not received your monitor within 1 week.   Helpful Tips for Accurate Home Blood Pressure Checks  Don't smoke, exercise, or drink caffeine 30 minutes before checking your BP Use the restroom before checking your BP (a full bladder can raise your  pressure) Relax in a comfortable upright chair Feet on the ground Left arm resting comfortably on a flat surface at the level of your heart Legs uncrossed Back supported Sit quietly and don't talk Place the cuff on your bare arm Adjust snuggly, so that only two fingertips can fit between your skin and the top of the cuff Check 2 readings separated by at least one minute Keep a log of your BP readings For a visual, please reference this diagram: http://ccnc.care/bpdiagram  Provider Name: [facility]     Phone: [phone]  Zone 1: ALL CLEAR  Continue to monitor your symptoms:  BP reading is less than 140 (top number) or less than 90 (bottom number)  No right upper stomach pain No headaches or seeing spots No feeling nauseated or throwing up No swelling in face and hands  Zone 2: CAUTION Call your doctor's office for any of the following:  BP reading is greater than 140 (top number) or greater than 90 (bottom number)  Stomach pain under your ribs in the middle or right side Headaches or seeing spots Feeling nauseated or throwing up Swelling in face and hands  Zone 3: EMERGENCY  Seek immediate medical care if you have any of the following:  BP reading is greater than160 (top number) or greater than 110 (bottom number) Severe headaches not improving with Tylenol Serious difficulty catching your breath Any worsening symptoms from Zone 2  Preterm Labor and Birth Information  The normal length of a pregnancy is 39-41 weeks. Preterm labor is when labor starts before 37 completed weeks of pregnancy. What are the risk factors for preterm labor? Preterm labor is more likely to occur in women who: Have certain infections during pregnancy such as a bladder infection, sexually transmitted infection, or infection inside the uterus (chorioamnionitis). Have a shorter-than-normal cervix. Have gone into preterm labor before. Have had surgery on their cervix. Are younger than age 68  or older than age 60. Are African American. Are pregnant with twins or multiple babies (multiple gestation). Take street drugs or smoke while pregnant. Do not gain enough weight while pregnant. Became pregnant shortly after having been pregnant. What are the symptoms of preterm labor? Symptoms of preterm labor include: Cramps similar to those that can happen during a menstrual period. The cramps may happen with diarrhea. Pain in the abdomen or lower back. Regular uterine contractions that may feel like tightening of the abdomen. A feeling of increased pressure in the pelvis. Increased watery or bloody mucus discharge from the vagina. Water breaking (ruptured amniotic sac). Why is it important to recognize signs of preterm labor? It is important to recognize signs of preterm labor because babies who are born prematurely may not be fully developed. This can put them at an increased risk for: Long-term (chronic) heart and lung problems. Difficulty immediately after birth with regulating body systems, including blood sugar, body temperature, heart rate, and breathing rate. Bleeding in the brain. Cerebral palsy. Learning difficulties. Death. These risks are highest for babies who are born before 34 weeks  of pregnancy. How is preterm labor treated? Treatment depends on the length of your pregnancy, your condition, and the health of your baby. It may involve: Having a stitch (suture) placed in your cervix to prevent your cervix from opening too early (cerclage). Taking or being given medicines, such as: Hormone medicines. These may be given early in pregnancy to help support the pregnancy. Medicine to stop contractions. Medicines to help mature the baby's lungs. These may be prescribed if the risk of delivery is high. Medicines to prevent your baby from developing cerebral palsy. If the labor happens before 34 weeks of pregnancy, you may need to stay in the hospital. What should I do if I  think I am in preterm labor? If you think that you are going into preterm labor, call your health care provider right away. How can I prevent preterm labor in future pregnancies? To increase your chance of having a full-term pregnancy: Do not use any tobacco products, such as cigarettes, chewing tobacco, and e-cigarettes. If you need help quitting, ask your health care provider. Do not use street drugs or medicines that have not been prescribed to you during your pregnancy. Talk with your health care provider before taking any herbal supplements, even if you have been taking them regularly. Make sure you gain a healthy amount of weight during your pregnancy. Watch for infection. If you think that you might have an infection, get it checked right away. Make sure to tell your health care provider if you have gone into preterm labor before. This information is not intended to replace advice given to you by your health care provider. Make sure you discuss any questions you have with your health care provider. Document Revised: 11/12/2018 Document Reviewed: 12/12/2015 Elsevier Patient Education  2020 [employer].

## 2024-02-22 NOTE — Progress Notes (Signed)
 HIGH-RISK PREGNANCY VISIT Patient name: Julia Sanders MRN [id number]  Date of birth: 05/08/2001 Chief Complaint:   Routine Prenatal Visit  History of Present Illness:   Julia Sanders is a 23 y.o. [phone] female at [redacted]w[redacted]d with an Estimated Date of Delivery: 03/24/24 being seen today for ongoing management of a high-risk pregnancy complicated by gestational hypertension currently on labetalol  200mg  BID.    Today she reports no complaints. Denies ha, visual changes, ruq/epigastric pain, n/v.   Contractions: Not present. Vag. Bleeding: None.  Movement: Present. denies leaking of fluid.      12/25/2023   10:38 AM 09/10/2023    2:34 PM  Depression screen PHQ 2/9  Decreased Interest 0 1  Down, Depressed, Hopeless 0 1  PHQ - 2 Score 0 2  Altered sleeping 0 1  Tired, decreased energy 0 1  Change in appetite 0 1  Feeling bad or failure about yourself  0 0  Trouble concentrating 0 0  Moving slowly or fidgety/restless 0 0  Suicidal thoughts 0 0  PHQ-9 Score 0 5  Difficult doing work/chores Not difficult at all         12/25/2023   10:39 AM 09/10/2023    2:34 PM  GAD 7 : Generalized Anxiety Score  Nervous, Anxious, on Edge 0 0  Control/stop worrying 0 0  Worry too much - different things 0 0  Trouble relaxing 0 1  Restless 0 0  Easily annoyed or irritable 0 1  Afraid - awful might happen 0 0  Total GAD 7 Score 0 2  Anxiety Difficulty Not difficult at all      Review of Systems:   Pertinent items are noted in HPI Denies abnormal vaginal discharge w/ itching/odor/irritation, headaches, visual changes, shortness of breath, chest pain, abdominal pain, severe nausea/vomiting, or problems with urination or bowel movements unless otherwise stated above. Pertinent History Reviewed:  Reviewed past medical,surgical, social, obstetrical and family history.  Reviewed problem list, medications and allergies. Physical Assessment:   Vitals:   02/22/24 1546  BP: 125/86  Pulse: 88   Weight: 252 lb (114.3 kg)  Body mass index is 33.25 kg/m.           Physical Examination:   General appearance: alert, well appearing, and in no distress  Mental status: alert, oriented to person, place, and time  Skin: warm & dry   Extremities:      Cardiovascular: normal heart rate noted  Respiratory: normal respiratory effort, no distress  Abdomen: gravid, soft, non-tender  Pelvic: Cervical exam performed  Dilation: Closed Effacement (%): Thick Station: Ballotable  Fetal Status: Fetal Heart Rate (bpm): +u/s   Movement: Present    Fetal Surveillance Testing today: US  35+4 wks,cephalic,FHR 123 BPM,RI .57,.64,.59=62%,BPP 8/8,AFI 15 cm,anterior placenta gr 3   Chaperone: Alan Fischer  Results for orders placed or performed in visit on 02/22/24 (from the past 24 hours)  POC Urinalysis Dipstick OB   Collection Time: 02/22/24  3:49 PM  Result Value Ref Range   Color, UA     Clarity, UA     Glucose, UA Negative Negative   Bilirubin, UA     Ketones, UA neg    Spec Grav, UA     Blood, UA neg    pH, UA     POC,PROTEIN,UA Negative Negative, Trace, Small (1+), Moderate (2+), Large (3+), 4+   Urobilinogen, UA     Nitrite, UA neg    Leukocytes, UA Trace (A) Negative  Appearance     Odor      Assessment & Plan:  High-risk pregnancy: H6E9888 at [redacted]w[redacted]d with an Estimated Date of Delivery: 03/24/24   1) GHTN w/ h/o severe pre-e, stable on labetalol  200mg  BID, check bp bid, let us  know if starts to elevated, reviewed pre-e s/s, reasons to seek care  Meds: No orders of the defined types were placed in this encounter.  Labs/procedures today: GBS, GC/CT, SVE, and U/S  Treatment Plan:  2x/wk testing, IOL 37w (wants 8/1 d/t childcare), IOL scheduled and form faxed/orders entered  Reviewed: Preterm labor symptoms and general obstetric precautions including but not limited to vaginal bleeding, contractions, leaking of fluid and fetal movement were reviewed in detail with the patient.  All  questions were answered. Does have home bp cuff. Office bp cuff given: not applicable. Check bp twice daily, let us  know if consistently >140 and/or >90.  Follow-up: Return for As scheduled.   Future Appointments  Date Time Provider Department Center  02/25/2024  3:30 PM [facility] NURSE [facility]  02/29/2024  9:15 AM [facility] - [facility]TO[facility] US  [facility]-FT[facility]G None  02/29/2024 10:10 AM Ozan, Jennifer, DO [facility]  03/03/2024  3:30 PM [facility] NURSE [facility]  03/04/2024  6:30 AM [facility]-LD SCHED ROOM [facility]-INDC None    Orders Placed This Encounter  Procedures   Culture, beta strep (group b only)   POC Urinalysis Dipstick OB   Suzen JONELLE Fetters CNM, [facility] 02/22/2024 4:20 PM

## 2024-02-22 NOTE — Progress Notes (Signed)
 US  35+4 wks,cephalic,FHR 123 BPM,RI .57,.64,.59=62%,BPP 8/8,AFI 15 cm,anterior placenta gr 3

## 2024-02-24 LAB — CERVICOVAGINAL ANCILLARY ONLY
Chlamydia: NEGATIVE
Comment: NEGATIVE
Comment: NORMAL
Neisseria Gonorrhea: NEGATIVE

## 2024-02-25 ENCOUNTER — Ambulatory Visit

## 2024-02-25 VITALS — BP 136/80 | Wt 249.0 lb

## 2024-02-25 DIAGNOSIS — O0993 Supervision of high risk pregnancy, unspecified, third trimester: Secondary | ICD-10-CM | POA: Diagnosis not present

## 2024-02-25 DIAGNOSIS — Z3A36 36 weeks gestation of pregnancy: Secondary | ICD-10-CM | POA: Diagnosis not present

## 2024-02-25 NOTE — Progress Notes (Signed)
   NURSE VISIT- NST  SUBJECTIVE:  Julia Sanders is a 23 y.o. [phone] female at [redacted]w[redacted]d, here for a NST for pregnancy complicated by [facility].  She reports active fetal movement, contractions: none, vaginal bleeding: none, membranes: intact.   OBJECTIVE:  BP 136/80   Wt 249 lb (112.9 kg)   LMP  (LMP Unknown)   BMI 32.85 kg/m   Appears well, no apparent distress  No results found for this or any previous visit (from the past 24 hours).  NST: FHR baseline 140 bpm, Variability: moderate, Accelerations:present, Decelerations:  Absent= Cat 1/reactive Toco: none   ASSESSMENT: H6E9888 at [redacted]w[redacted]d with GHTN NST reactive  PLAN: EFM strip reviewed by Dr. Ozan   Recommendations: keep next appointment as scheduled    Julia Sanders  02/25/2024 3:43 PM

## 2024-02-26 ENCOUNTER — Other Ambulatory Visit: Payer: Self-pay | Admitting: Obstetrics & Gynecology

## 2024-02-26 DIAGNOSIS — O133 Gestational [pregnancy-induced] hypertension without significant proteinuria, third trimester: Secondary | ICD-10-CM

## 2024-02-26 LAB — CULTURE, BETA STREP (GROUP B ONLY): Strep Gp B Culture: POSITIVE — AB

## 2024-02-29 ENCOUNTER — Ambulatory Visit: Admitting: Obstetrics & Gynecology

## 2024-02-29 ENCOUNTER — Ambulatory Visit

## 2024-02-29 ENCOUNTER — Telehealth (HOSPITAL_COMMUNITY): Payer: Self-pay | Admitting: *Deleted

## 2024-02-29 VITALS — BP 132/85 | HR 80 | Wt 254.4 lb

## 2024-02-29 DIAGNOSIS — O133 Gestational [pregnancy-induced] hypertension without significant proteinuria, third trimester: Secondary | ICD-10-CM

## 2024-02-29 DIAGNOSIS — Z3A36 36 weeks gestation of pregnancy: Secondary | ICD-10-CM

## 2024-02-29 DIAGNOSIS — O0990 Supervision of high risk pregnancy, unspecified, unspecified trimester: Secondary | ICD-10-CM

## 2024-02-29 DIAGNOSIS — O0993 Supervision of high risk pregnancy, unspecified, third trimester: Secondary | ICD-10-CM | POA: Diagnosis not present

## 2024-02-29 NOTE — Progress Notes (Signed)
 US  36+4 wks,cephalic,anterior placenta gr 3,AFI 10 cm,RI .58,.57,.54,.61=54%,BPP 8/8,FHR 153 bpm

## 2024-02-29 NOTE — Telephone Encounter (Signed)
 Preadmission screen

## 2024-02-29 NOTE — Progress Notes (Signed)
 HIGH-RISK PREGNANCY VISIT Patient name: Julia Sanders MRN [id number]  Date of birth: Feb 06, 2001 Chief Complaint:   Routine Prenatal Visit  History of Present Illness:   Julia Sanders is a 23 y.o. [phone] female at [redacted]w[redacted]d with an Estimated Date of Delivery: 03/24/24 being seen today for ongoing management of a high-risk pregnancy complicated by:  -Gestational HTN BP stable on current medication Denies headaches, sometimes change in vision where she sees spots, but nothing currently.  Denies RUQ pain  -GBS positive   Today she reports no complaints.   Contractions: Not present. Vag. Bleeding: None.  Movement: Present. denies leaking of fluid.      12/25/2023   10:38 AM 09/10/2023    2:34 PM  Depression screen PHQ 2/9  Decreased Interest 0 1  Down, Depressed, Hopeless 0 1  PHQ - 2 Score 0 2  Altered sleeping 0 1  Tired, decreased energy 0 1  Change in appetite 0 1  Feeling bad or failure about yourself  0 0  Trouble concentrating 0 0  Moving slowly or fidgety/restless 0 0  Suicidal thoughts 0 0  PHQ-9 Score 0 5  Difficult doing work/chores Not difficult at all      Current Outpatient Medications  Medication Instructions   acetaminophen  (TYLENOL ) 650 mg, Oral, Every 4 hours PRN   acetaminophen  (TYLENOL ) 1,000 mg, Every 6 hours PRN   acetaminophen -caffeine  (EXCEDRIN TENSION HEADACHE) 500-65 MG TABS per tablet 2 tablets, Oral, 4 times daily PRN   aspirin  EC 162 mg, Oral, Daily, Swallow whole.   ferrous sulfate  325 mg, Oral, Daily   hydrocortisone  (ANUSOL -HC) 2.5 % rectal cream Rectal, 2 times daily   hydrOXYzine  (ATARAX ) 25 mg, Oral, 3 times daily PRN   labetalol  (NORMODYNE ) 200 mg, Oral, 2 times daily   metoCLOPramide  (REGLAN ) 10 mg, Oral, Every 6 hours   prenatal vitamin w/FE, FA (PRENATAL 1 + 1) 27-1 MG TABS tablet 1 tablet, Oral, Daily     Review of Systems:   Pertinent items are noted in HPI Denies abnormal vaginal discharge w/ itching/odor/irritation,  headaches, visual changes, shortness of breath, chest pain, abdominal pain, severe nausea/vomiting, or problems with urination or bowel movements unless otherwise stated above. Pertinent History Reviewed:  Reviewed past medical,surgical, social, obstetrical and family history.  Reviewed problem list, medications and allergies. Physical Assessment:   Vitals:   02/29/24 0957  BP: 132/85  Pulse: 80  Weight: 254 lb 6.4 oz (115.4 kg)  Body mass index is 33.56 kg/m.           Physical Examination:   General appearance: alert, well appearing, and in no distress  Mental status: normal mood, behavior, speech, dress, motor activity, and thought processes  Skin: warm & dry   Extremities: no edema, no calf tenderness bilaterally     Cardiovascular: normal heart rate noted  Respiratory: normal respiratory effort, no distress  Abdomen: gravid, soft, non-tender  Pelvic: Cervical exam deferred         Fetal Status:     Movement: Present    Fetal Surveillance Testing today: cephalic,anterior placenta gr 3,AFI 10 cm,RI .58,.57,.54,.61=54%,BPP 8/8,FHR 153 bpm    Chaperone: N/A    No results found for this or any previous visit (from the past 24 hours).   Assessment & Plan:  High-risk pregnancy: H6E9888 at [redacted]w[redacted]d with an Estimated Date of Delivery: 03/24/24   1. Gestational hypertension, third trimester: labetalol  200 BID -Continue with current medication - Normal antepartum testing today - Reviewed preeclampsia  precautions, IOL scheduled for 8-1   Meds: No orders of the defined types were placed in this encounter.   Labs/procedures today: BPP  Treatment Plan:  routine OB care and as outlined above  Reviewed: Preterm labor symptoms and general obstetric precautions including but not limited to vaginal bleeding, contractions, leaking of fluid and fetal movement were reviewed in detail with the patient.  All questions were answered. PT has home bp cuff. Check bp weekly, let us  know if  >140/90.   Follow-up: Return for as scheduled.   Future Appointments  Date Time Provider Department Center  03/03/2024  3:30 PM [facility] NURSE [facility]  03/04/2024  6:30 AM [facility]-LD SCHED ROOM [facility] None    No orders of the defined types were placed in this encounter.   Dream Nodal, DO Attending Obstetrician & Gynecologist, [facility] for [employer], [facility]

## 2024-03-01 ENCOUNTER — Other Ambulatory Visit: Payer: Self-pay | Admitting: Women's Health

## 2024-03-02 ENCOUNTER — Telehealth (HOSPITAL_COMMUNITY): Payer: Self-pay | Admitting: *Deleted

## 2024-03-02 ENCOUNTER — Encounter (HOSPITAL_COMMUNITY): Payer: Self-pay | Admitting: *Deleted

## 2024-03-02 NOTE — Telephone Encounter (Signed)
 Preadmission screen

## 2024-03-03 ENCOUNTER — Ambulatory Visit: Admitting: *Deleted

## 2024-03-03 VITALS — BP 127/86 | HR 84 | Wt 252.0 lb

## 2024-03-03 DIAGNOSIS — O0993 Supervision of high risk pregnancy, unspecified, third trimester: Secondary | ICD-10-CM | POA: Diagnosis not present

## 2024-03-03 DIAGNOSIS — O0990 Supervision of high risk pregnancy, unspecified, unspecified trimester: Secondary | ICD-10-CM

## 2024-03-03 DIAGNOSIS — O133 Gestational [pregnancy-induced] hypertension without significant proteinuria, third trimester: Secondary | ICD-10-CM | POA: Diagnosis not present

## 2024-03-03 DIAGNOSIS — Z3A37 37 weeks gestation of pregnancy: Secondary | ICD-10-CM | POA: Diagnosis not present

## 2024-03-03 NOTE — Progress Notes (Addendum)
   NURSE VISIT- NST  SUBJECTIVE:  Julia Sanders is a 23 y.o. [phone] female at [redacted]w[redacted]d, here for a NST for pregnancy complicated by [facility].  She reports active fetal movement, contractions: none, vaginal bleeding: none, membranes: intact.   OBJECTIVE:  BP 127/86   Pulse 84   Wt 252 lb (114.3 kg)   LMP  (LMP Unknown)   BMI 33.25 kg/m   Appears well, no apparent distress  No results found for this or any previous visit (from the past 24 hours).  NST: FHR baseline 135 bpm, Variability: moderate, Accelerations:present, Decelerations:  Absent= Cat 1/reactive Toco: none   ASSESSMENT: [id number] at [redacted]w[redacted]d with GHTN NST reactive  PLAN: EFM strip reviewed by Julia Sanders, CNM   Recommendations: keep next appointment as scheduled    Julia Sanders  03/03/2024 4:21 PM Chart reviewed for nurse visit. Agree with plan of care.  Julia Sanders, [place] 03/03/2024 9:34 PM

## 2024-03-04 ENCOUNTER — Other Ambulatory Visit: Payer: Self-pay

## 2024-03-04 ENCOUNTER — Inpatient Hospital Stay (HOSPITAL_COMMUNITY): Admitting: Anesthesiology

## 2024-03-04 ENCOUNTER — Encounter (HOSPITAL_COMMUNITY): Payer: Self-pay | Admitting: Obstetrics & Gynecology

## 2024-03-04 ENCOUNTER — Inpatient Hospital Stay (HOSPITAL_COMMUNITY)
Admission: AD | Admit: 2024-03-04 | Discharge: 2024-03-06 | DRG: 807 | Disposition: A | Discharge status: Home / Self Care | Attending: Obstetrics and Gynecology | Admitting: Obstetrics and Gynecology

## 2024-03-04 ENCOUNTER — Inpatient Hospital Stay (HOSPITAL_COMMUNITY): Admission: RE | Admit: 2024-03-04 | Source: Ambulatory Visit

## 2024-03-04 DIAGNOSIS — Z79899 Other long term (current) drug therapy: Secondary | ICD-10-CM

## 2024-03-04 DIAGNOSIS — Z3A37 37 weeks gestation of pregnancy: Secondary | ICD-10-CM

## 2024-03-04 DIAGNOSIS — O0993 Supervision of high risk pregnancy, unspecified, third trimester: Secondary | ICD-10-CM

## 2024-03-04 DIAGNOSIS — O134 Gestational [pregnancy-induced] hypertension without significant proteinuria, complicating childbirth: Secondary | ICD-10-CM | POA: Diagnosis present

## 2024-03-04 DIAGNOSIS — K219 Gastro-esophageal reflux disease without esophagitis: Secondary | ICD-10-CM | POA: Diagnosis present

## 2024-03-04 DIAGNOSIS — O9902 Anemia complicating childbirth: Secondary | ICD-10-CM | POA: Diagnosis present

## 2024-03-04 DIAGNOSIS — O133 Gestational [pregnancy-induced] hypertension without significant proteinuria, third trimester: Secondary | ICD-10-CM

## 2024-03-04 DIAGNOSIS — O9962 Diseases of the digestive system complicating childbirth: Secondary | ICD-10-CM | POA: Diagnosis present

## 2024-03-04 DIAGNOSIS — O99824 Streptococcus B carrier state complicating childbirth: Secondary | ICD-10-CM | POA: Diagnosis present

## 2024-03-04 DIAGNOSIS — O139 Gestational [pregnancy-induced] hypertension without significant proteinuria, unspecified trimester: Principal | ICD-10-CM | POA: Diagnosis present

## 2024-03-04 DIAGNOSIS — O99820 Streptococcus B carrier state complicating pregnancy: Secondary | ICD-10-CM | POA: Diagnosis not present

## 2024-03-04 LAB — CBC
HCT: 32.3 % — ABNORMAL LOW (ref 36.0–46.0)
HCT: 33.7 % — ABNORMAL LOW (ref 36.0–46.0)
Hemoglobin: 10.5 g/dL — ABNORMAL LOW (ref 12.0–15.0)
Hemoglobin: 10.8 g/dL — ABNORMAL LOW (ref 12.0–15.0)
MCH: 27.5 pg (ref 26.0–34.0)
MCH: 27.6 pg (ref 26.0–34.0)
MCHC: 32.5 g/dL (ref 30.0–36.0)
MCHC: 32 g/dL (ref 30.0–36.0)
MCV: 84.6 fL (ref 80.0–100.0)
MCV: 86.2 fL (ref 80.0–100.0)
Platelets: 267 K/uL (ref 150–400)
Platelets: 281 K/uL (ref 150–400)
RBC: 3.82 MIL/uL — ABNORMAL LOW (ref 3.87–5.11)
RBC: 3.91 MIL/uL (ref 3.87–5.11)
RDW: 15.5 % (ref 11.5–15.5)
RDW: 15.7 % — ABNORMAL HIGH (ref 11.5–15.5)
WBC: 9.6 K/uL (ref 4.0–10.5)
WBC: 10.7 K/uL — ABNORMAL HIGH (ref 4.0–10.5)
nRBC: 0 % (ref 0.0–0.2)
nRBC: 0 % (ref 0.0–0.2)

## 2024-03-04 LAB — COMPREHENSIVE METABOLIC PANEL WITH GFR
ALT: 7 U/L (ref 0–44)
AST: 24 U/L (ref 15–41)
Albumin: 2.5 g/dL — ABNORMAL LOW (ref 3.5–5.0)
Alkaline Phosphatase: 120 U/L (ref 38–126)
Anion gap: 12 (ref 5–15)
BUN: <5 mg/dL — ABNORMAL LOW (ref 6–20)
CO2: 19 mmol/L — ABNORMAL LOW (ref 22–32)
Calcium: 8.4 mg/dL — ABNORMAL LOW (ref 8.9–10.3)
Chloride: 105 mmol/L (ref 98–111)
Creatinine, Ser: 0.79 mg/dL (ref 0.44–1.00)
GFR, Estimated: >60 mL/min (ref >60)
Glucose, Bld: 123 mg/dL — ABNORMAL HIGH (ref 70–99)
Potassium: 3.2 mmol/L — ABNORMAL LOW (ref 3.5–5.1)
Sodium: 136 mmol/L (ref 135–145)
Total Bilirubin: 0.5 mg/dL (ref 0.0–1.2)
Total Protein: 6.6 g/dL (ref 6.5–8.1)

## 2024-03-04 LAB — TYPE AND SCREEN
ABO/RH(D): O POS
Antibody Screen: NEGATIVE

## 2024-03-04 LAB — PROTEIN / CREATININE RATIO, URINE
Creatinine, Urine: 93 mg/dL
Protein Creatinine Ratio: 0.16 mg/mg{creat} — ABNORMAL HIGH (ref 0.00–0.15)
Total Protein, Urine: 15 mg/dL

## 2024-03-04 LAB — RPR: RPR Ser Ql: NONREACTIVE

## 2024-03-04 MED ORDER — BENZOCAINE-MENTHOL 20-0.5 % EX AERO: 1.0000 | INHALATION_SPRAY | CUTANEOUS | Status: DC | PRN

## 2024-03-04 MED ORDER — PRENATAL MULTIVITAMIN CH
1.0000 | ORAL_TABLET | Freq: Every day | ORAL | Status: DC
  Administered 2024-03-05: 1 via ORAL
  Filled 2024-03-04: qty 1

## 2024-03-04 MED ORDER — FENTANYL-BUPIVACAINE-NACL 0.5-0.125-0.9 MG/250ML-% EP SOLN
12.0000 mL/h | EPIDURAL | Status: DC | PRN
  Administered 2024-03-04: 14 mL/h via EPIDURAL
  Filled 2024-03-04: qty 250

## 2024-03-04 MED ORDER — TERBUTALINE SULFATE 1 MG/ML IJ SOLN: 0.2500 mg | Freq: Once | INTRAMUSCULAR | Status: DC | PRN

## 2024-03-04 MED ORDER — PENICILLIN G POT IN DEXTROSE 60000 UNIT/ML IV SOLN
3.0000 10*6.[IU] | INTRAVENOUS | Status: DC
  Administered 2024-03-04 (×2): 3 10*6.[IU] via INTRAVENOUS
  Filled 2024-03-04 (×4): qty 50

## 2024-03-04 MED ORDER — FLEET ENEMA RE ENEM: 1.0000 | ENEMA | RECTAL | Status: DC | PRN

## 2024-03-04 MED ORDER — OXYTOCIN-SODIUM CHLORIDE 30-0.9 UT/500ML-% IV SOLN: 1.0000 m[IU]/min | INTRAVENOUS | Status: DC

## 2024-03-04 MED ORDER — MISOPROSTOL 50MCG HALF TABLET
50.0000 ug | ORAL_TABLET | Freq: Once | ORAL | Status: AC
  Administered 2024-03-04: 50 ug via ORAL
  Filled 2024-03-04: qty 1

## 2024-03-04 MED ORDER — LIDOCAINE HCL (PF) 1 % IJ SOLN
INTRAMUSCULAR | Status: DC | PRN
  Administered 2024-03-04 (×2): 6 mL via EPIDURAL

## 2024-03-04 MED ORDER — LACTATED RINGERS IV SOLN: 500.0000 mL | INTRAVENOUS | Status: DC | PRN

## 2024-03-04 MED ORDER — SODIUM CHLORIDE 0.9 % IV SOLN
5.0000 10*6.[IU] | Freq: Once | INTRAVENOUS | Status: AC
  Administered 2024-03-04: 5 10*6.[IU] via INTRAVENOUS
  Filled 2024-03-04: qty 5

## 2024-03-04 MED ORDER — ACETAMINOPHEN 500 MG PO TABS: 1000.0000 mg | ORAL_TABLET | Freq: Four times a day (QID) | ORAL | Status: DC | PRN

## 2024-03-04 MED ORDER — SENNOSIDES-DOCUSATE SODIUM 8.6-50 MG PO TABS
2.0000 | ORAL_TABLET | Freq: Every day | ORAL | Status: DC
  Administered 2024-03-05: 2 via ORAL
  Filled 2024-03-04: qty 2

## 2024-03-04 MED ORDER — COCONUT OIL OIL
1.0000 | TOPICAL_OIL | Status: DC | PRN
Start: 2024-03-04 — End: 2024-03-06

## 2024-03-04 MED ORDER — AMLODIPINE BESYLATE 5 MG PO TABS
5.0000 mg | ORAL_TABLET | Freq: Every day | ORAL | Status: DC
  Administered 2024-03-05: 5 mg via ORAL
  Filled 2024-03-04: qty 1

## 2024-03-04 MED ORDER — PHENYLEPHRINE 80 MCG/ML (10ML) SYRINGE FOR IV PUSH (FOR BLOOD PRESSURE SUPPORT): 80.0000 ug | PREFILLED_SYRINGE | INTRAVENOUS | Status: DC | PRN

## 2024-03-04 MED ORDER — OXYCODONE-ACETAMINOPHEN 5-325 MG PO TABS: 1.0000 | ORAL_TABLET | ORAL | Status: DC | PRN

## 2024-03-04 MED ORDER — OXYTOCIN-SODIUM CHLORIDE 30-0.9 UT/500ML-% IV SOLN
2.5000 [IU]/h | INTRAVENOUS | Status: DC
  Filled 2024-03-04: qty 500

## 2024-03-04 MED ORDER — EPHEDRINE 5 MG/ML INJ: 10.0000 mg | INTRAVENOUS | Status: DC | PRN

## 2024-03-04 MED ORDER — LABETALOL HCL 5 MG/ML IV SOLN: 80.0000 mg | INTRAVENOUS | Status: DC | PRN

## 2024-03-04 MED ORDER — DIBUCAINE (PERIANAL) 1 % EX OINT: 1.0000 | TOPICAL_OINTMENT | CUTANEOUS | Status: DC | PRN

## 2024-03-04 MED ORDER — OXYCODONE HCL 5 MG PO TABS: 10.0000 mg | ORAL_TABLET | ORAL | Status: DC | PRN

## 2024-03-04 MED ORDER — FUROSEMIDE 20 MG PO TABS: 20.0000 mg | ORAL_TABLET | Freq: Every day | ORAL | Status: DC

## 2024-03-04 MED ORDER — POTASSIUM CHLORIDE CRYS ER 20 MEQ PO TBCR
20.0000 meq | EXTENDED_RELEASE_TABLET | Freq: Every day | ORAL | Status: DC
  Administered 2024-03-05: 20 meq via ORAL
  Filled 2024-03-04: qty 1

## 2024-03-04 MED ORDER — LIDOCAINE HCL (PF) 1 % IJ SOLN: 30.0000 mL | INTRAMUSCULAR | Status: DC | PRN

## 2024-03-04 MED ORDER — OXYCODONE-ACETAMINOPHEN 5-325 MG PO TABS: 2.0000 | ORAL_TABLET | ORAL | Status: DC | PRN

## 2024-03-04 MED ORDER — POTASSIUM CHLORIDE CRYS ER 20 MEQ PO TBCR: 20.0000 meq | EXTENDED_RELEASE_TABLET | Freq: Every day | ORAL | Status: DC

## 2024-03-04 MED ORDER — IBUPROFEN 600 MG PO TABS
600.0000 mg | ORAL_TABLET | Freq: Four times a day (QID) | ORAL | Status: DC
  Administered 2024-03-04 – 2024-03-06 (×6): 600 mg via ORAL
  Filled 2024-03-04 (×6): qty 1

## 2024-03-04 MED ORDER — OXYCODONE HCL 5 MG PO TABS: 5.0000 mg | ORAL_TABLET | ORAL | Status: DC | PRN

## 2024-03-04 MED ORDER — ONDANSETRON HCL 4 MG/2ML IJ SOLN: 4.0000 mg | Freq: Four times a day (QID) | INTRAMUSCULAR | Status: DC | PRN

## 2024-03-04 MED ORDER — LACTATED RINGERS IV SOLN: INTRAVENOUS | Status: DC

## 2024-03-04 MED ORDER — ONDANSETRON HCL 4 MG/2ML IJ SOLN: 4.0000 mg | INTRAMUSCULAR | Status: DC | PRN

## 2024-03-04 MED ORDER — SIMETHICONE 80 MG PO CHEW: 80.0000 mg | CHEWABLE_TABLET | ORAL | Status: DC | PRN

## 2024-03-04 MED ORDER — LABETALOL HCL 5 MG/ML IV SOLN: 40.0000 mg | INTRAVENOUS | Status: DC | PRN

## 2024-03-04 MED ORDER — DIPHENHYDRAMINE HCL 50 MG/ML IJ SOLN: 12.5000 mg | INTRAMUSCULAR | Status: DC | PRN

## 2024-03-04 MED ORDER — FERROUS SULFATE 325 (65 FE) MG PO TABS
325.0000 mg | ORAL_TABLET | Freq: Two times a day (BID) | ORAL | Status: DC
  Administered 2024-03-05 (×2): 325 mg via ORAL
  Filled 2024-03-04 (×2): qty 1

## 2024-03-04 MED ORDER — ONDANSETRON HCL 4 MG PO TABS: 4.0000 mg | ORAL_TABLET | ORAL | Status: DC | PRN

## 2024-03-04 MED ORDER — FENTANYL CITRATE (PF) 100 MCG/2ML IJ SOLN: 100.0000 ug | INTRAMUSCULAR | Status: DC | PRN

## 2024-03-04 MED ORDER — POTASSIUM CHLORIDE CRYS ER 20 MEQ PO TBCR
40.0000 meq | EXTENDED_RELEASE_TABLET | Freq: Once | ORAL | Status: AC
  Administered 2024-03-04: 40 meq via ORAL
  Filled 2024-03-04 (×2): qty 2

## 2024-03-04 MED ORDER — FUROSEMIDE 20 MG PO TABS
20.0000 mg | ORAL_TABLET | Freq: Every day | ORAL | Status: DC
  Administered 2024-03-05: 20 mg via ORAL
  Filled 2024-03-04: qty 1

## 2024-03-04 MED ORDER — SOD CITRATE-CITRIC ACID 500-334 MG/5ML PO SOLN: 30.0000 mL | ORAL | Status: DC | PRN

## 2024-03-04 MED ORDER — LABETALOL HCL 5 MG/ML IV SOLN: 20.0000 mg | INTRAVENOUS | Status: DC | PRN

## 2024-03-04 MED ORDER — ACETAMINOPHEN 325 MG PO TABS: 650.0000 mg | ORAL_TABLET | ORAL | Status: DC | PRN

## 2024-03-04 MED ORDER — WITCH HAZEL-GLYCERIN EX PADS: 1.0000 | MEDICATED_PAD | CUTANEOUS | Status: DC | PRN

## 2024-03-04 MED ORDER — OXYTOCIN-SODIUM CHLORIDE 30-0.9 UT/500ML-% IV SOLN
1.0000 m[IU]/min | INTRAVENOUS | Status: DC
  Administered 2024-03-04: 2 m[IU]/min via INTRAVENOUS

## 2024-03-04 MED ORDER — HYDRALAZINE HCL 20 MG/ML IJ SOLN: 10.0000 mg | INTRAMUSCULAR | Status: DC | PRN

## 2024-03-04 MED ORDER — LACTATED RINGERS IV SOLN: 500.0000 mL | Freq: Once | INTRAVENOUS | Status: DC

## 2024-03-04 MED ORDER — HYDROXYZINE HCL 50 MG PO TABS: 50.0000 mg | ORAL_TABLET | Freq: Four times a day (QID) | ORAL | Status: DC | PRN

## 2024-03-04 MED ORDER — MISOPROSTOL 25 MCG QUARTER TABLET
25.0000 ug | ORAL_TABLET | Freq: Once | ORAL | Status: AC
  Administered 2024-03-04: 25 ug via VAGINAL
  Filled 2024-03-04: qty 1

## 2024-03-04 MED ORDER — DIPHENHYDRAMINE HCL 25 MG PO CAPS: 25.0000 mg | ORAL_CAPSULE | Freq: Four times a day (QID) | ORAL | Status: DC | PRN

## 2024-03-04 MED ORDER — OXYTOCIN BOLUS FROM INFUSION
333.0000 mL | Freq: Once | INTRAVENOUS | Status: AC
  Administered 2024-03-04: 333 mL via INTRAVENOUS

## 2024-03-04 NOTE — Progress Notes (Signed)
 Patient ID: Charmaine LOISE Bars, female   DOB: March 04, 2001, 22 y.o.   MRN: [id number]  BP 133/85   Pulse 84   Temp 98.8 F (37.1 C) (Oral)   Resp 16   Ht 6' 1 (1.854 m)   Wt 114.3 kg   LMP  (LMP Unknown)   SpO2 99%   BMI 33.25 kg/m   Dilation: 5 Effacement (%): 80 Cervical Position: Posterior Station: -1 Presentation: Vertex Exam by:: FABIENE Ruddle, SNM  Epidural placed and patient comfortable. SNM to bedside for AROM. SVE finds fetal head well applied to cervix. AROM successful on second attempt with [place]. Clear fluid returned and bloody show seen. FHR remained Cat 1 throughout and following AROM. UC q 2-2.5 min.  Vernell Ruddle, SNM 03/04/24 1800

## 2024-03-04 NOTE — Anesthesia Procedure Notes (Signed)
 Epidural Patient location during procedure: OB Start time: 03/04/2024 4:22 PM End time: 03/04/2024 4:31 PM  Staffing Anesthesiologist: Jerrye Sharper, MD Performed: anesthesiologist   Preanesthetic Checklist Completed: patient identified, IV checked, site marked, risks and benefits discussed, surgical consent, monitors and equipment checked, pre-op evaluation and timeout performed  Epidural Patient position: sitting Prep: DuraPrep and site prepped and draped Patient monitoring: continuous pulse ox and blood pressure Approach: midline Location: L3-L4 Injection technique: LOR air  Needle:  Needle type: Tuohy  Needle gauge: 17 G Needle length: 9 cm and 9 Needle insertion depth: 5 cm Catheter type: closed end flexible Catheter size: 19 Gauge Catheter at skin depth: 10 cm Test dose: negative and Other  Assessment Events: blood not aspirated, no cerebrospinal fluid, injection not painful, no injection resistance, no paresthesia and negative IV test  Additional Notes Patient identified. Risks and benefits discussed including failed block, incomplete  Pain control, post dural puncture headache, nerve damage, paralysis, blood pressure Changes, nausea, vomiting, reactions to medications-both toxic and allergic and post Partum back pain. All questions were answered. Patient expressed understanding and wished to proceed. Sterile technique was used throughout procedure. Epidural site was Dressed with sterile barrier dressing. No paresthesias, signs of intravascular injection Or signs of intrathecal spread were encountered.  Patient was more comfortable after the epidural was dosed. Please see RN's note for documentation of vital signs and FHR which are stable. Reason for block:procedure for pain

## 2024-03-04 NOTE — Progress Notes (Signed)
 Patient ID: Julia Sanders, female   DOB: 2001-07-02, 23 y.o.   MRN: [id number]  BP 135/80   Pulse 83   Temp 98.3 F (36.8 C) (Oral)   Resp 16   Ht 6' 1 (1.854 m)   Wt 114.3 kg   LMP  (LMP Unknown)   BMI 33.25 kg/m   Dilation: 3 Effacement (%): 60 Cervical Position: Posterior Station: -2 Presentation: Vertex Exam by:: Honore, SNM   Cervix is soft and now dilated to 3 cm. Irregular contractions with uterine irritability seen on TOCO; feeling some cramping. Will begin Pitocin  2x2. Recommended patient ambulate or sit on ball as able. FHR Cat 1. Will continue to reassess and AROM when able.  Vernell Honore, SNM  03/04/24 1321

## 2024-03-04 NOTE — Plan of Care (Signed)
  Problem: Education: Goal: Knowledge of General Education information will improve Description: Including pain rating scale, medication(s)/side effects and non-pharmacologic comfort measures Outcome: Completed/Met   Problem: Health Behavior/Discharge Planning: Goal: Ability to manage health-related needs will improve Outcome: Completed/Met   Problem: Clinical Measurements: Goal: Ability to maintain clinical measurements within normal limits will improve Outcome: Completed/Met Goal: Will remain free from infection Outcome: Completed/Met Goal: Diagnostic test results will improve Outcome: Completed/Met Goal: Respiratory complications will improve Outcome: Completed/Met Goal: Cardiovascular complication will be avoided Outcome: Completed/Met   Problem: Activity: Goal: Risk for activity intolerance will decrease Outcome: Completed/Met   Problem: Nutrition: Goal: Adequate nutrition will be maintained Outcome: Completed/Met   Problem: Coping: Goal: Level of anxiety will decrease Outcome: Completed/Met   Problem: Elimination: Goal: Will not experience complications related to bowel motility Outcome: Completed/Met Goal: Will not experience complications related to urinary retention Outcome: Completed/Met   Problem: Pain Managment: Goal: General experience of comfort will improve and/or be controlled Outcome: Completed/Met   Problem: Safety: Goal: Ability to remain free from injury will improve Outcome: Completed/Met   Problem: Skin Integrity: Goal: Risk for impaired skin integrity will decrease Outcome: Completed/Met   Problem: Education: Goal: Knowledge of disease or condition will improve Outcome: Completed/Met Goal: Knowledge of the prescribed therapeutic regimen will improve Outcome: Completed/Met   Problem: Fluid Volume: Goal: Peripheral tissue perfusion will improve Outcome: Completed/Met   Problem: Clinical Measurements: Goal: Complications related to  disease process, condition or treatment will be avoided or minimized Outcome: Completed/Met

## 2024-03-04 NOTE — Discharge Summary (Signed)
 Postpartum Discharge Summary  Date of Service updated-8/3     Patient Name: Julia Sanders DOB: [birth year] MRN: [id number]  Date of admission: 03/04/2024 Delivery date:03/04/2024 Delivering provider: HONORE MILLMAN E Date of discharge: 03/06/2024  Admitting diagnosis: Gestational hypertension [O13.9] Intrauterine pregnancy: [redacted]w[redacted]d     Secondary diagnosis:  Principal Problem:   Gestational hypertension  Additional problems: none    Discharge diagnosis: Term Pregnancy Delivered and Gestational Hypertension                                              Postpartum procedures:none Augmentation: AROM, Pitocin , and Cytotec  Complications: None  Hospital course: Induction of Labor With Vaginal Delivery   23 y.o. yo [id number] at [redacted]w[redacted]d was admitted to the hospital 03/04/2024 for induction of labor.  Indication for induction: Gestational hypertension.  Patient had an labor course complicated by none. Membrane Rupture Time/Date: 5:53 PM,03/04/2024  Delivery Method:Vaginal, Spontaneous Operative Delivery:N/A Episiotomy: None Lacerations:  1st degree;Vaginal Details of delivery can be found in separate delivery note.  Patient had a postpartum course complicated by gestational HTN she was treated with Lasix  and potassium.  She was initially given oral Norvasc ; however, her BP was almost hypotensive.  This medication was held and she will be followed closely as an outpatientPatient is discharged home 03/06/24.  Newborn Data: Birth date:03/04/2024 Birth time:6:58 PM Gender:Female Living status:Living Apgars:9 ,9  Weight:   Magnesium  Sulfate received: No BMZ received: No Rhophylac:N/A MMR:N/A T-DaP:Given prenatally Flu: N/A RSV Vaccine received: No Transfusion:No  Immunizations received: Immunization History  Administered Date(s) Administered   Tdap 01/28/2024    Physical exam  Vitals:   03/05/24 1735 03/05/24 2110 03/06/24 0440 03/06/24 0906  BP: 122/89 105/61 (!) 85/47 118/76   Pulse: 72 62 60 70  Resp: 17 18    Temp: 98.7 F (37.1 C) 97.9 F (36.6 C) 98.1 F (36.7 C)   TempSrc: Oral Oral Oral   SpO2: 100% 100% 100%   Weight:      Height:       General: alert, cooperative, and no distress Lochia: appropriate Uterine Fundus: firm Incision: N/A DVT Evaluation: No evidence of DVT seen on physical exam. Labs: Lab Results  Component Value Date   WBC 10.7 (H) 03/04/2024   HGB 10.8 (L) 03/04/2024   HCT 33.7 (L) 03/04/2024   MCV 86.2 03/04/2024   PLT 281 03/04/2024      Latest Ref Rng & Units 03/04/2024    7:11 AM  CMP  Glucose 70 - 99 mg/dL 876   BUN 6 - 20 mg/dL <5   Creatinine 9.55 - 1.00 mg/dL 9.20   Sodium 864 - 854 mmol/L 136   Potassium 3.5 - 5.1 mmol/L 3.2   Chloride 98 - 111 mmol/L 105   CO2 22 - 32 mmol/L 19   Calcium 8.9 - 10.3 mg/dL 8.4   Total Protein 6.5 - 8.1 g/dL 6.6   Total Bilirubin 0.0 - 1.2 mg/dL 0.5   Alkaline Phos 38 - 126 U/L 120   AST 15 - 41 U/L 24   ALT 0 - 44 U/L 7    Edinburgh Score:    01/15/2023    3:14 PM  Edinburgh Postnatal Depression Scale Screening Tool  I have been able to laugh and see the funny side of things. 0  I have looked forward with enjoyment to  things. 0  I have blamed myself unnecessarily when things went wrong. 0  I have been anxious or worried for no good reason. 0  I have felt scared or panicky for no good reason. 0  Things have been getting on top of me. 0  I have been so unhappy that I have had difficulty sleeping. 0  I have felt sad or miserable. 0  I have been so unhappy that I have been crying. 0  The thought of harming myself has occurred to me. 0  Edinburgh Postnatal Depression Scale Total 0      Data saved with a previous flowsheet row definition   No data recorded  After visit meds:  Allergies as of 03/06/2024   No Known Allergies      Medication List     STOP taking these medications    aspirin  EC 81 MG tablet   labetalol  200 MG tablet Commonly known as:  NORMODYNE    metoCLOPramide  10 MG tablet Commonly known as: REGLAN        TAKE these medications    acetaminophen  325 MG tablet Commonly known as: Tylenol  Take 2 tablets (650 mg total) by mouth every 4 (four) hours as needed for mild pain, headache or fever (for pain scale < 4). What changed: Another medication with the same name was removed. Continue taking this medication, and follow the directions you see here.   acetaminophen -caffeine  500-65 MG Tabs per tablet Commonly known as: EXCEDRIN TENSION HEADACHE Take 2 tablets by mouth 4 (four) times daily as needed (Foe headaches).   ferrous sulfate  325 (65 FE) MG tablet Take 1 tablet (325 mg total) by mouth every other day. What changed: when to take this   furosemide  20 MG tablet Commonly known as: LASIX  Take 1 tablet (20 mg total) by mouth daily for 5 days.   hydrocortisone  2.5 % rectal cream Commonly known as: ANUSOL -HC Place rectally 2 (two) times daily.   hydrOXYzine  25 MG tablet Commonly known as: ATARAX  Take 1 tablet (25 mg total) by mouth 3 (three) times daily as needed.   ibuprofen  600 MG tablet Commonly known as: ADVIL  Take 1 tablet (600 mg total) by mouth every 6 (six) hours.   potassium chloride  SA 20 MEQ tablet Commonly known as: KLOR-CON  M Take 1 tablet (20 mEq total) by mouth daily for 5 days.   prenatal vitamin w/FE, FA 27-1 MG Tabs tablet Take 1 tablet by mouth daily at 12 noon.      Discharge home in stable condition Infant Feeding: Bottle Infant Disposition:NICU Discharge instruction: per After Visit Summary and Postpartum booklet. Activity: Advance as tolerated. Pelvic rest for 6 weeks.  Diet: routine diet Future Appointments:No future appointments.  Follow up Visit:  Follow-up Information     [facility] for [facility] Follow up.   Specialty: Obstetrics and Gynecology Contact information: [address] [place]   [address] [phone]               Message sent to [facility] on 03/06/24 by JINNY Finder, CNM Please schedule this patient for a In person postpartum visit in 6 weeks with the following provider: Any provider. Additional Postpartum F/U:BP check 1 week  High risk pregnancy complicated by: HTN Delivery mode:  Vaginal, Spontaneous Anticipated Birth Control:  Nexplanon to be placed as outpatient   03/06/2024 Arilyn Brierley M Yolanda Dockendorf, DO

## 2024-03-04 NOTE — Lactation Note (Signed)
 This note was copied from a baby's chart. Lactation Consultation Note  Patient Name: Julia Sanders Unijb'd Date: 03/04/2024 Age:25 hours  Mom chooses to formula feed.   Maternal Data    Feeding    LATCH Score                    Lactation Tools Discussed/Used    Interventions    Discharge    Consult Status Consult Status: Complete    [place] G 03/04/2024, 8:14 PM

## 2024-03-04 NOTE — Progress Notes (Signed)
 Patient ID: Julia Sanders, female   DOB: [birth year], 23 y.o.   MRN: [id number]  Vitals:   03/04/24 0735 03/04/24 0809  BP: (!) 148/83 134/79  Pulse: 99 95  Resp: 16   Temp: 98.3 F (36.8 C)    Dilation: 1.5 Effacement (%): 60 Station: -2 Presentation: Vertex Exam by:: Nicholaus, RN  Dual Cytotec  given at 0805 (50 oral, 25 vaginal). Feeling abdominal cramps now. Resting in bed with partner and mother at bedside. Plan to recheck SVE at approx 1200. FHR Cat 1.  GBS pos > first dose Penicillin  completed  gHTN > BP mild range this morning without home Labetalol , plan to hold. Pre-e labs normal this AM, pr/cr collected now by RN. K 3.2; will replace with oral K 40meq once.  Vernell Ruddle, SNM 03/04/24 1011

## 2024-03-04 NOTE — Anesthesia Preprocedure Evaluation (Signed)
 Anesthesia Evaluation  Patient identified by MRN, date of birth, ID band Patient awake    Reviewed: Allergy & Precautions, Patient's Chart, lab work & pertinent test results  Airway Mallampati: II  TM Distance: >3 FB Neck ROM: Full    Dental no notable dental hx.    Pulmonary neg pulmonary ROS   Pulmonary exam normal breath sounds clear to auscultation       Cardiovascular hypertension, Normal cardiovascular exam Rhythm:Regular Rate:Normal     Neuro/Psych negative neurological ROS  negative psych ROS   GI/Hepatic Neg liver ROS,GERD  ,,  Endo/Other  negative endocrine ROS  Obesity  Renal/GU negative Renal ROS  negative genitourinary   Musculoskeletal negative musculoskeletal ROS (+)    Abdominal  (+) + obese  Peds negative pediatric ROS (+)  Hematology  (+) Blood dyscrasia, anemia Hb 10.3, plt 347   Anesthesia Other Findings   Reproductive/Obstetrics (+) Pregnancy Gestational HTN                              Anesthesia Physical Anesthesia Plan  ASA: 3  Anesthesia Plan: Epidural   Post-op Pain Management:    Induction:   PONV Risk Score and Plan: 2  Airway Management Planned: Natural Airway  Additional Equipment: None and Fetal Monitoring  Intra-op Plan:   Post-operative Plan:   Informed Consent: I have reviewed the patients History and Physical, chart, labs and discussed the procedure including the risks, benefits and alternatives for the proposed anesthesia with the patient or authorized representative who has indicated his/her understanding and acceptance.       Plan Discussed with: Anesthesiologist  Anesthesia Plan Comments:         Anesthesia Quick Evaluation

## 2024-03-04 NOTE — H&P (Signed)
 OBSTETRIC ADMISSION HISTORY AND PHYSICAL  Julia Sanders is a 23 y.o. female [phone] with IUP at [redacted]w[redacted]d by 7 week US  presenting for IOL for GHTN. She reports +FMs, No LOF, no VB, no blurry vision, headaches or peripheral edema, and RUQ pain.  She plans on formula feeding. She request Nexplanon at [facility] for [facility] control. She received her prenatal care at [facility]   Dating: By 7 week US  --->  Estimated Date of Delivery: 03/24/24  Sono:   @[redacted]w[redacted]d ,  cephalic,anterior placenta gr 3,AFI 10 cm,RI .58,.57,.54,.61=54%,BPP 8/8,FHR 153 bpm     Prenatal History/Complications:  - GHTN on Labetalol  200 mg BID - GBS positive - BMI 33 - Hx of severe preeclampsia - Interstitial cystitis  Past Medical History: Past Medical History:  Diagnosis Date   Interstitial cystitis    Pregnancy induced hypertension     Past Surgical History: Past Surgical History:  Procedure Laterality Date   cyst removed from jaw     TUMOR REMOVAL     Left ovary    Obstetrical History: OB History     Gravida  3   Para  1   Term      Preterm  1   AB  1   Living  1      SAB  1   IAB      Ectopic      Multiple  0   Live Births  1           Social History Social History   Socioeconomic History   Marital status: Single    Spouse name: Not on file   Number of children: Not on file   Years of education: Not on file   Highest education level: Not on file  Occupational History   Not on file  Tobacco Use   Smoking status: Never   Smokeless tobacco: Never   Tobacco comments:    Quit vaping with +preg  Vaping Use   Vaping status: Former  Substance and Sexual Activity   Alcohol use: No   Drug use: No   Sexual activity: Not Currently    Birth control/protection: None  Other Topics Concern   Not on file  Social History Narrative   Not on file   Social Drivers of Health   Financial Resource Strain: Low Risk  (09/10/2023)   Overall Financial Resource Strain (CARDIA)    Difficulty  of Paying Living Expenses: Not hard at all  Food Insecurity: No Food Insecurity (09/10/2023)   Hunger Vital Sign    Worried About Running Out of Food in the Last Year: Never true    Ran Out of Food in the Last Year: Never true  Transportation Needs: No Transportation Needs (09/10/2023)   PRAPARE - Administrator, Civil Service (Medical): No    Lack of Transportation (Non-Medical): No  Physical Activity: Insufficiently Active (09/10/2023)   Exercise Vital Sign    Days of Exercise per Week: 2 days    Minutes of Exercise per Session: 30 min  Stress: No Stress Concern Present (09/10/2023)   [employer] of Occupational Health - Occupational Stress Questionnaire    Feeling of Stress : Not at all  Social Connections: Moderately Integrated (09/10/2023)   Social Connection and Isolation Panel    Frequency of Communication with Friends and Family: More than three times a week    Frequency of Social Gatherings with Friends and Family: Three times a week    Attends Religious Services:  1 to 4 times per year    Active Member of Clubs or Organizations: No    Attends Banker Meetings: Never    Marital Status: Living with partner    Family History: Family History  Problem Relation Age of Onset   Healthy Mother    Kidney Stones Father    Drug abuse Maternal Grandmother        overdose   Cirrhosis Paternal Grandmother    Other Paternal Grandfather        blockage in heart    Allergies: No Known Allergies  No medications prior to admission.     Review of Systems   All systems reviewed and negative except as stated in HPI  not currently breastfeeding. General appearance: alert, cooperative, appears stated age, and no distress Lungs: clear to auscultation bilaterally Heart: regular rate and rhythm Abdomen: soft, non-tender; bowel sounds normal Pelvic: adequate, proven to 3190g Extremities: Homans sign is negative, no sign of DVT DTR's 2+ Presentation:  cephalic Fetal monitoringBaseline: 155 bpm, Variability: Good {> 6 bpm), Accelerations: Reactive, and Decelerations: Absent Uterine activityFrequency: Every 5 minutes     Prenatal labs: ABO, Rh: O/Positive/-- (02/06 1635) Antibody: Negative (05/23 0918) Rubella: 1.77 (02/06 1635) RPR: Non Reactive (05/23 0918)  HBsAg: Negative (02/06 1635)  HIV: Non Reactive (05/23 0918)  GBS: Positive/-- (07/21 1600)    Lab Results  Component Value Date   GBS Positive (A) 02/22/2024   GTT normal Genetic screening  negative, low risk female Anatomy US  normal  Immunization History  Administered Date(s) Administered   Tdap 01/28/2024    Prenatal Transfer Tool  Maternal Diabetes: No Genetic Screening: Normal Maternal Ultrasounds/Referrals: Normal Fetal Ultrasounds or other Referrals:  None Maternal Substance Abuse:  No Significant Maternal Medications:  None Significant Maternal Lab Results: Group B Strep positive Number of Prenatal Visits:greater than 3 verified prenatal visits Maternal Vaccinations:TDap and Flu Other Comments:  None   No results found for this or any previous visit (from the past 24 hours).  Patient Active Problem List   Diagnosis Date Noted   Interstitial cystitis 02/04/2024   Gestational hypertension 01/28/2024   History of severe pre-eclampsia 09/09/2023   Supervision of high risk pregnancy, antepartum 09/09/2023    Assessment/Plan:  Julia Sanders is a 23 y.o. [id number] at [redacted]w[redacted]d here for IOL for GHTN  #Labor:Dual cytotec  #Pain: Per pt request #FWB: Cat I #GBS status:  Positive; PCN #Feeding: Formula #Reproductive Life planning: Nexplanon #Circ:  yes  #GHTN On Labetalol  200 mg BID - preE labs pending  Mardy Shropshire, MD  03/04/2024, 3:40 AM

## 2024-03-05 NOTE — Progress Notes (Signed)
 POSTPARTUM PROGRESS NOTE  PPD#1  Subjective:  DEVYNE HAUGER is a 23 y.o. [phone] s/p NSVD at [redacted]w[redacted]d. Today she notes she is doing well. She denies any problems with ambulating, voiding or po intake. Denies nausea or vomiting. She has passed flatus, no BM.  Pain is well controlled.  Lochia appropriate Denies fever/chills/chest pain/SOB.  no HA, no blurry vision, no RUQ pain  Objective: Blood pressure 136/78, pulse 74, temperature 98.5 F (36.9 C), temperature source Oral, resp. rate 16, height 6' 1 (1.854 m), weight 114.3 kg, SpO2 97%, unknown if currently breastfeeding.  Physical Exam:  General: alert, cooperative and no distress Chest: no respiratory distress Heart: regular rate and rhythm Abdomen: soft, nontender Uterine Fundus: firm, appropriately tender Incision: NA DVT Evaluation: No calf swelling or tenderness Extremities: no edema Skin: warm, dry  Results for orders placed or performed during the hospital encounter of 03/04/24 (from the past 24 hours)  CBC     Status: Abnormal   Collection Time: 03/04/24  3:01 PM  Result Value Ref Range   WBC 10.7 (H) 4.0 - 10.5 K/uL   RBC 3.91 3.87 - 5.11 MIL/uL   Hemoglobin 10.8 (L) 12.0 - 15.0 g/dL   HCT 66.2 (L) 63.9 - 53.9 %   MCV 86.2 80.0 - 100.0 fL   MCH 27.6 26.0 - 34.0 pg   MCHC 32.0 30.0 - 36.0 g/dL   RDW 84.2 (H) 88.4 - 84.4 %   Platelets 281 150 - 400 K/uL   nRBC 0.0 0.0 - 0.2 %    Assessment/Plan: NOREENE BOREMAN is a 23 y.o. [phone] s/p NSVD at [redacted]w[redacted]d PPD#1 complicated by: 1) GestHTN - Currently on Norvasc  5 mg daily and Lasix  20 x 7 days -BP stable  2) Postpartum care - Meeting milestones appropriately  Contraception: Nexplanon outpatient Feeding: Baby in NICU Desires circumcision, informed consent obtained  Dispo: Continue routine postpartum care.  Plan for discharge home tomorrow   LOS: 1 day   Khaleesi Gruel, DO Faculty Attending, [facility] 03/05/2024, 10:20 AM

## 2024-03-05 NOTE — Anesthesia Postprocedure Evaluation (Signed)
 Anesthesia Post Note  Patient: Julia Sanders  Procedure(s) Performed: AN AD HOC LABOR EPIDURAL     Patient location during evaluation: Mother Baby Anesthesia Type: Epidural Level of consciousness: awake Pain management: satisfactory to patient Vital Signs Assessment: post-procedure vital signs reviewed and stable Respiratory status: spontaneous breathing Cardiovascular status: stable Anesthetic complications: no   No notable events documented.  Last Vitals:  Vitals:   03/05/24 0429 03/05/24 0825  BP: (!) 108/58 118/70  Pulse: 78 73  Resp: 17 18  Temp: 36.8 C 37 C  SpO2: 100% 97%    Last Pain:  Vitals:   03/05/24 0825  TempSrc: Oral  PainSc:    Pain Goal:                   [employer]

## 2024-03-06 LAB — BIRTH TISSUE RECOVERY COLLECTION (PLACENTA DONATION)

## 2024-03-06 MED ORDER — FERROUS SULFATE 325 (65 FE) MG PO TABS: 325.0000 mg | ORAL_TABLET | ORAL | 0 refills | Status: DC

## 2024-03-06 MED ORDER — FUROSEMIDE 20 MG PO TABS: 20.0000 mg | ORAL_TABLET | Freq: Every day | ORAL | 0 refills | Status: DC

## 2024-03-06 MED ORDER — IBUPROFEN 600 MG PO TABS: 600.0000 mg | ORAL_TABLET | Freq: Four times a day (QID) | ORAL | 0 refills | Status: AC

## 2024-03-06 MED ORDER — POTASSIUM CHLORIDE CRYS ER 20 MEQ PO TBCR: 20.0000 meq | EXTENDED_RELEASE_TABLET | Freq: Every day | ORAL | 0 refills | Status: DC

## 2024-03-19 ENCOUNTER — Telehealth (HOSPITAL_COMMUNITY): Payer: Self-pay | Admitting: *Deleted

## 2024-03-19 NOTE — Telephone Encounter (Signed)
 Attempted hospital discharge follow-up phone call. Call was disconnected after RN greeted patient. Return call was placed and forwarded to voicemail. RN left message instructing patient to return the call to address any questions or concerns. Allean IVAR Carton, RN, 03/19/24, 1226

## 2024-04-18 ENCOUNTER — Ambulatory Visit: Admitting: Women's Health

## 2024-04-27 ENCOUNTER — Ambulatory Visit (INDEPENDENT_AMBULATORY_CARE_PROVIDER_SITE_OTHER): Admitting: Women's Health

## 2024-04-27 ENCOUNTER — Encounter: Payer: Self-pay | Admitting: Women's Health

## 2024-04-27 DIAGNOSIS — Z8759 Personal history of other complications of pregnancy, childbirth and the puerperium: Secondary | ICD-10-CM | POA: Diagnosis not present

## 2024-04-27 DIAGNOSIS — Z3202 Encounter for pregnancy test, result negative: Secondary | ICD-10-CM

## 2024-04-27 DIAGNOSIS — Z30017 Encounter for initial prescription of implantable subdermal contraceptive: Secondary | ICD-10-CM | POA: Diagnosis not present

## 2024-04-27 LAB — POCT URINE PREGNANCY: Preg Test, Ur: NEGATIVE

## 2024-04-27 MED ORDER — ETONOGESTREL 68 MG ~~LOC~~ IMPL
68.0000 mg | DRUG_IMPLANT | Freq: Once | SUBCUTANEOUS | Status: AC
  Administered 2024-04-27: 68 mg via SUBCUTANEOUS

## 2024-04-27 NOTE — Patient Instructions (Signed)
 Keep the area clean and dry.  You can remove the big bandage in 24 hours, and the small steri-strip bandage in 3-5 days.  A back up method, such as condoms, should be used for two weeks. You may have irregular vaginal bleeding for the first 6 months after the Nexplanon  is placed, then the bleeding usually lightens and it is possible that you may not have any periods.  If you have any concerns, please give us  a call.    Etonogestrel  Implant What is this medication? ETONOGESTREL  (et oh noe JES trel) prevents ovulation and pregnancy. It belongs to a group of medications called contraceptives. This medication is a progestin hormone. This medicine may be used for other purposes; ask your health care provider or pharmacist if you have questions. COMMON BRAND NAME(S): Implanon , Nexplanon  What should I tell my care team before I take this medication? They need to know if you have any of these conditions: Abnormal vaginal bleeding Blood clots Blood vessel disease Breast, cervical, endometrial, ovarian, liver, or uterine cancer Diabetes Gallbladder disease Heart disease or recent heart attack High blood pressure High cholesterol or triglycerides Kidney disease Liver disease Migraine headaches Seizures Stroke Tobacco use An unusual or allergic reaction to etonogestrel , other medications, foods, dyes, or preservatives Pregnant or trying to get pregnant Breastfeeding How should I use this medication? This device is inserted just under the skin on the inner side of your upper arm by your care team. Talk to your care team about the use of this medication in children. Special care may be needed. Overdosage: If you think you have taken too much of this medicine contact a poison control center or emergency room at once. NOTE: This medicine is only for you. Do not share this medicine with others. What if I miss a dose? This does not apply. What may interact with this medication? Do not take this  medication with any of the following: Amprenavir Fosamprenavir This medication may also interact with the following: Acitretin Aprepitant Armodafinil Bexarotene Bosentan Carbamazepine Certain antivirals for HIV or hepatitis Certain medications for fungal infections, such as fluconazole , ketoconazole, itraconazole, or voriconazole Cyclosporine Felbamate Griseofulvin Lamotrigine Modafinil Oxcarbazepine Phenobarbital Phenytoin Primidone Rifabutin Rifampin Rifapentine St. John's wort Topiramate This list may not describe all possible interactions. Give your health care provider a list of all the medicines, herbs, non-prescription drugs, or dietary supplements you use. Also tell them if you smoke, drink alcohol, or use illegal drugs. Some items may interact with your medicine. What should I watch for while using this medication? Visit your care team for regular checks on your progress. Using this medication does not protect you or your partner against HIV or other sexually transmitted infections (STIs). You should be able to feel the implant by pressing your fingertips over the skin where it was inserted. Contact your care team if you cannot feel the implant, and use a non-hormonal birth control method (such as condoms) until your care team confirms that the implant is in place. Contact your care team if you think that the implant may have broken or become bent while in your arm. You will receive a user card from your care team after the implant is inserted. The card is a record of the location of the implant in your upper arm and when it should be removed. Keep this card with your health records. What side effects may I notice from receiving this medication? Side effects that you should report to your care team as soon as  possible: Allergic reactions--skin rash, itching, hives, swelling of the face, lips, tongue, or throat Blood clot--pain, swelling, or warmth in the leg, shortness of  breath, chest pain Gallbladder problems--severe stomach pain, nausea, vomiting, fever Increase in blood pressure Liver injury--right upper belly pain, loss of appetite, nausea, light-colored stool, dark yellow or brown urine, yellowing skin or eyes, unusual weakness or fatigue New or worsening migraines or headaches Pain, redness, or irritation at injection site Stroke--sudden numbness or weakness of the face, arm, or leg, trouble speaking, confusion, trouble walking, loss of balance or coordination, dizziness, severe headache, change in vision Unusual vaginal discharge, itching, or odor Worsening mood, feelings of depression Side effects that usually do not require medical attention (report to your care team if they continue or are bothersome): Breast pain or tenderness Dark patches of skin on the face or other sun-exposed areas Irregular menstrual cycles or spotting Nausea Weight gain This list may not describe all possible side effects. Call your doctor for medical advice about side effects. You may report side effects to FDA at 1-800-FDA-1088. Where should I keep my medication? This medication is given in a hospital or clinic and will not be stored at home. NOTE: This sheet is a summary. It may not cover all possible information. If you have questions about this medicine, talk to your doctor, pharmacist, or health care provider.  2024 Elsevier/Gold Standard (2022-02-25 00:00:00)

## 2024-04-27 NOTE — Addendum Note (Signed)
 Addended by: ILEAN RUTHERFORD HERO on: 04/27/2024 04:23 PM   Modules accepted: Orders

## 2024-04-27 NOTE — Addendum Note (Signed)
 Addended by: ILEAN RUTHERFORD HERO on: 04/27/2024 03:56 PM   Modules accepted: Orders

## 2024-04-27 NOTE — Progress Notes (Signed)
 POSTPARTUM VISIT Patient name: Julia Sanders MRN [id number]  Date of birth: July 15, 2001 Chief Complaint:   Postpartum Care  History of Present Illness:   Julia Sanders is a 23 y.o. [id number] Caucasian female being seen today for a postpartum visit. She is 7 weeks postpartum following a spontaneous vaginal delivery at 37.1 gestational weeks. IOL: yes, for gestational hypertension . Anesthesia: epidural.  Laceration: 1st degree.  Complications: none. Inpatient contraception: no.   Pregnancy complicated by [employer]. Tobacco use: no. Substance use disorder: no. Last pap smear: 06/30/22 and results were NILM w/ HRHPV not done. Next pap smear due: 2026 Patient's last menstrual period was 04/13/2024.  Postpartum course has been uncomplicated. D/C'd on lasix /K+, no antihypertensives.  Bleeding none. Bowel function is normal. Bladder function is normal. Urinary incontinence? no, fecal incontinence? no Patient is sexually active. Last sexual activity: 9/16, used a condom. Desired contraception: Nexplanon . Patient does not want a pregnancy in the future.  Desired family size is 2 children.   Upstream - 04/27/24 1529       Pregnancy Intention Screening   Does the patient want to become pregnant in the next year? No    Does the patient's partner want to become pregnant in the next year? No    Would the patient like to discuss contraceptive options today? Yes      Contraception Wrap Up   Current Method Abstinence    End Method Hormonal Implant    Contraception Counseling Provided Yes         The pregnancy intention screening data noted above was reviewed. Potential methods of contraception were discussed. The patient elected to proceed with Hormonal Implant.  Edinburgh Postpartum Depression Screening: negative  Edinburgh Postnatal Depression Scale - 04/27/24 1532       Edinburgh Postnatal Depression Scale:  In the Past 7 Days   I have been able to laugh and see the funny side of things. 0     I have looked forward with enjoyment to things. 0    I have blamed myself unnecessarily when things went wrong. 0    I have been anxious or worried for no good reason. 0    I have felt scared or panicky for no good reason. 0    Things have been getting on top of me. 0    I have been so unhappy that I have had difficulty sleeping. 0    I have felt sad or miserable. 0    I have been so unhappy that I have been crying. 0    The thought of harming myself has occurred to me. 0    Edinburgh Postnatal Depression Scale Total 0             12/25/2023   10:39 AM 09/10/2023    2:34 PM  GAD 7 : Generalized Anxiety Score  Nervous, Anxious, on Edge 0 0  Control/stop worrying 0 0  Worry too much - different things 0 0  Trouble relaxing 0 1  Restless 0 0  Easily annoyed or irritable 0 1  Afraid - awful might happen 0 0  Total GAD 7 Score 0 2  Anxiety Difficulty Not difficult at all      Baby's course has been uncomplicated. Baby is feeding by bottle. Infant has a pediatrician/family doctor? Yes.  Childcare strategy if returning to work/school: school from home.  Pt has material needs met for her and baby: Yes.   Review of Systems:   Pertinent items  are noted in HPI Denies Abnormal vaginal discharge w/ itching/odor/irritation, headaches, visual changes, shortness of breath, chest pain, abdominal pain, severe nausea/vomiting, or problems with urination or bowel movements. Pertinent History Reviewed:  Reviewed past medical,surgical, obstetrical and family history.  Reviewed problem list, medications and allergies. OB History  Gravida Para Term Preterm AB Living  3 2 1 1 1 2   SAB IAB Ectopic Multiple Live Births  1   0 2    # Outcome Date GA Lbr Len/2nd Weight Sex Type Anes PTL Lv  3 Term 03/04/24 [redacted]w[redacted]d 02:48 / 00:10  M Vag-Spont EPI  LIV  2 Preterm 12/16/22 [redacted]w[redacted]d 02:20 / 01:12 7 lb 0.5 oz (3.19 kg) M Vag-Spont EPI  LIV     Complications: Severe pre-eclampsia  1 SAB            Physical  Assessment:   Vitals:   04/27/24 1536  BP: 129/85  Pulse: 89  Weight: 238 lb (108 kg)  Height: 6' 1 (1.854 m)  Body mass index is 31.4 kg/m.       Physical Examination:   General appearance: alert, well appearing, and in no distress  Mental status: alert, oriented to person, place, and time  Skin: warm & dry   Cardiovascular: normal heart rate noted   Respiratory: normal respiratory effort, no distress   Breasts: deferred, no complaints   Abdomen: soft, non-tender   Pelvic: examination not indicated. Thin prep pap obtained: No  Rectal: not examined  Extremities: Edema: none   Chaperone: N/A       No results found for this or any previous visit (from the past 24 hours).   NEXPLANON  INSERTION  Risks/benefits/side effects of Nexplanon  have been discussed and her questions have been answered.  Specifically, a failure rate of 08/998 has been reported, with an increased failure rate if pt takes St. John's Wort and/or antiseizure medicaitons.  She is aware of the common side effect of irregular bleeding, which the incidence of decreases over time. Signed copy of informed consent in chart.   Time out was performed.  She is right-handed, so her left arm, approximately 10cm from the medial epicondyle and 3-5cm posterior to the sulcus, was cleansed with alcohol and anesthetized with 2cc of 2% Lidocaine .  The area was cleansed again with betadine and the Nexplanon  was inserted per manufacturer's recommendations without difficulty.  3 steri-strips and pressure bandage were applied. The patient tolerated the procedure well.   Assessment & Plan:  1) Postpartum exam 2) 7 wks s/p spontaneous vaginal delivery after IOL for GHTN 3) bottle feeding 4) Depression screening 5) Nexplanon  insertion Pt was instructed to keep the area clean and dry, remove pressure bandage in 24 hours, and keep insertion site covered with the steri-strip for 3-5 days.  Condoms for 2 weeks.  She was given a card  indicating date Nexplanon  was inserted and date it needs to be removed. Follow-up PRN problems.  Essential components of care per ACOG recommendations:  1.  Mood and well being:  If positive depression screen, discussed and plan developed.  If using tobacco we discussed reduction/cessation and risk of relapse If current substance abuse, we discussed and referral to local resources was offered.   2. Infant care and feeding:  If breastfeeding, discussed returning to work, pumping, breastfeeding-associated pain, guidance regarding return to fertility while lactating if not using another method. If needed, patient was provided with a letter to be allowed to pump q 2-3hrs to support lactation in a  private location with access to a refrigerator to store breastmilk.   Recommended that all caregivers be immunized for flu, pertussis and other preventable communicable diseases If pt does not have material needs met for her/baby, referred to local resources for help obtaining these.  3. Sexuality, contraception and birth spacing Provided guidance regarding sexuality, management of dyspareunia, and resumption of intercourse Discussed avoiding interpregnancy interval <88mths and recommended birth spacing of 18 months  4. Sleep and fatigue Discussed coping options for fatigue and sleep disruption Encouraged family/partner/community support of 4 hrs of uninterrupted sleep to help with mood and fatigue  5. Physical recovery  If pt had a C/S, assessed incisional pain and providing guidance on normal vs prolonged recovery If pt had a laceration, perineal healing and pain reviewed.  If urinary or fecal incontinence, discussed management and referred to PT or uro/gyn if indicated  Patient is safe to resume physical activity. Discussed attainment of healthy weight.  6.  Chronic disease management Discussed pregnancy complications if any, and their implications for future childbearing and long-term maternal  health. Review recommendations for prevention of recurrent pregnancy complications, such as 17 hydroxyprogesterone caproate to reduce risk for recurrent PTB not applicable, or aspirin  to reduce risk of preeclampsia did not discuss. Pt had GDM: No. If yes, 2hr GTT scheduled: not applicable. Reviewed medications and non-pregnant dosing including consideration of whether pt is breastfeeding using a reliable resource such as LactMed: not applicable Referred for f/u w/ PCP or subspecialist providers as indicated: not applicable  7. Health maintenance Mammogram at 25yo or earlier if indicated Pap smears as indicated  Meds: No orders of the defined types were placed in this encounter.   Follow-up: Return in about 1 year (around 04/27/2025) for Physical.   No orders of the defined types were placed in this encounter.   Suzen JONELLE Fetters CNM, [facility] 04/27/2024 3:53 PM
